# Patient Record
Sex: Female | Born: 1940 | Race: White | Hispanic: No | Smoking: Never smoker
Health system: Southern US, Community
[De-identification: ages and names within clinical notes are randomized; demographics above are authoritative.]

## PROBLEM LIST (undated history)

## (undated) DIAGNOSIS — I10 Essential (primary) hypertension: Secondary | ICD-10-CM

## (undated) DIAGNOSIS — K219 Gastro-esophageal reflux disease without esophagitis: Secondary | ICD-10-CM

## (undated) DIAGNOSIS — E119 Type 2 diabetes mellitus without complications: Secondary | ICD-10-CM

---

## 2004-06-01 ENCOUNTER — Ambulatory Visit (HOSPITAL_BASED_OUTPATIENT_CLINIC_OR_DEPARTMENT_OTHER): Admission: RE | Admit: 2004-06-01 | Discharge: 2004-06-01 | Payer: Self-pay | Admitting: *Deleted

## 2004-06-01 ENCOUNTER — Ambulatory Visit (HOSPITAL_COMMUNITY): Admission: RE | Admit: 2004-06-01 | Discharge: 2004-06-01 | Payer: Self-pay | Admitting: *Deleted

## 2005-06-13 IMAGING — CR DG ANKLE COMPLETE 3+V*R*
3 series · 3 of 3 positions shown · non-contrast
Comparison: none

CLINICAL DATA: Fall, ankle pain

RIGHT ANKLE - 3 VIEW

[view not recorded (1 of 3)]
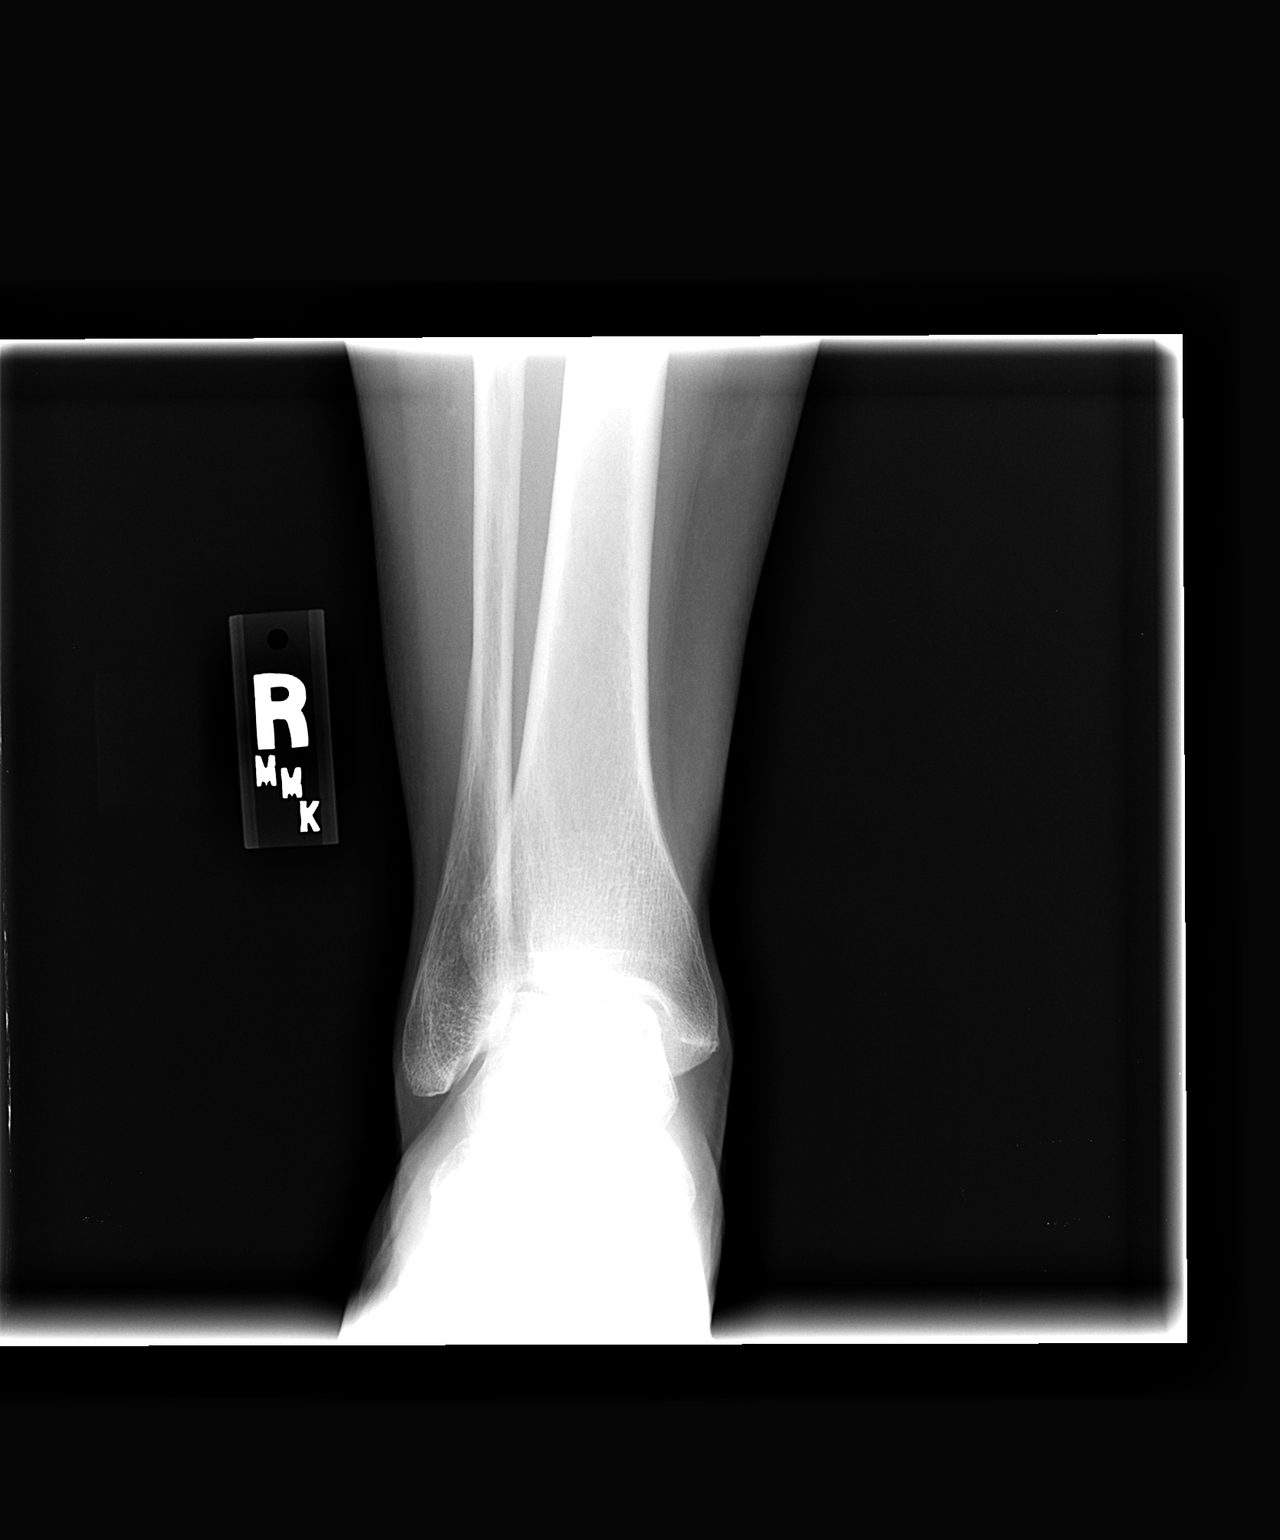

[view not recorded (2 of 3)]
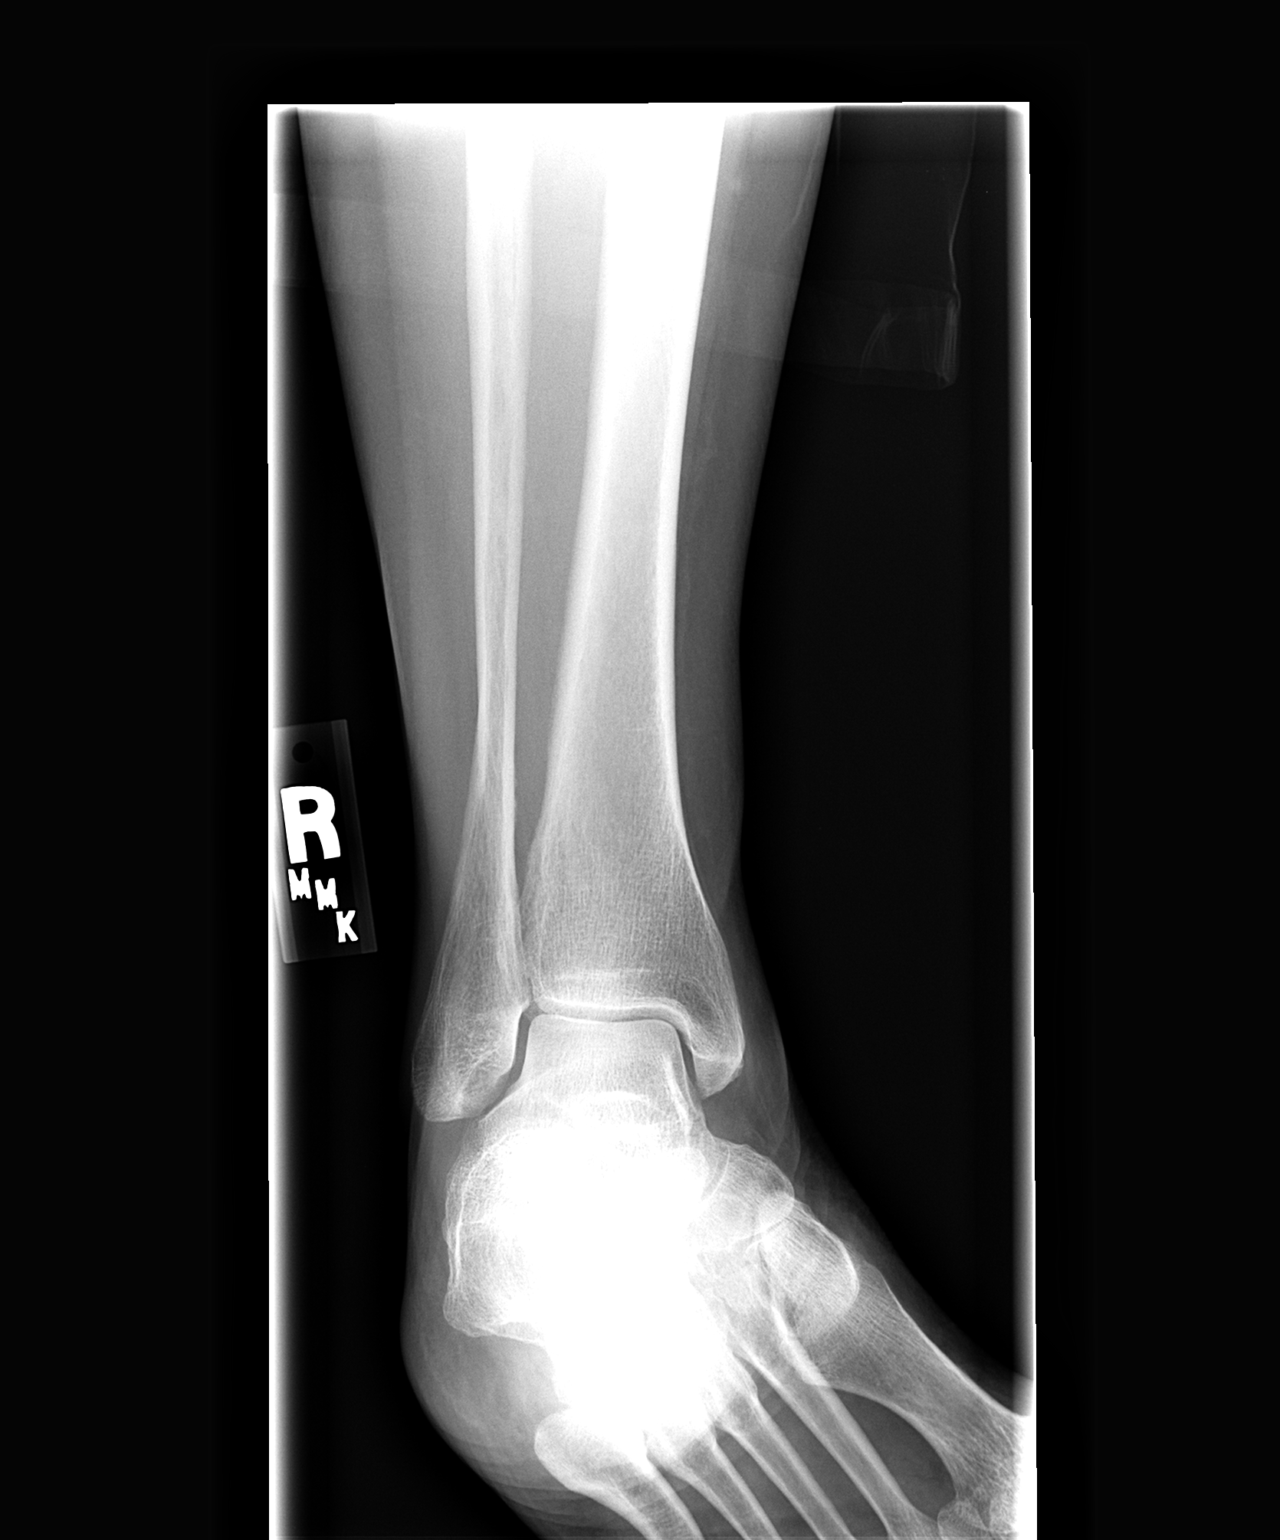

[view not recorded (3 of 3)]
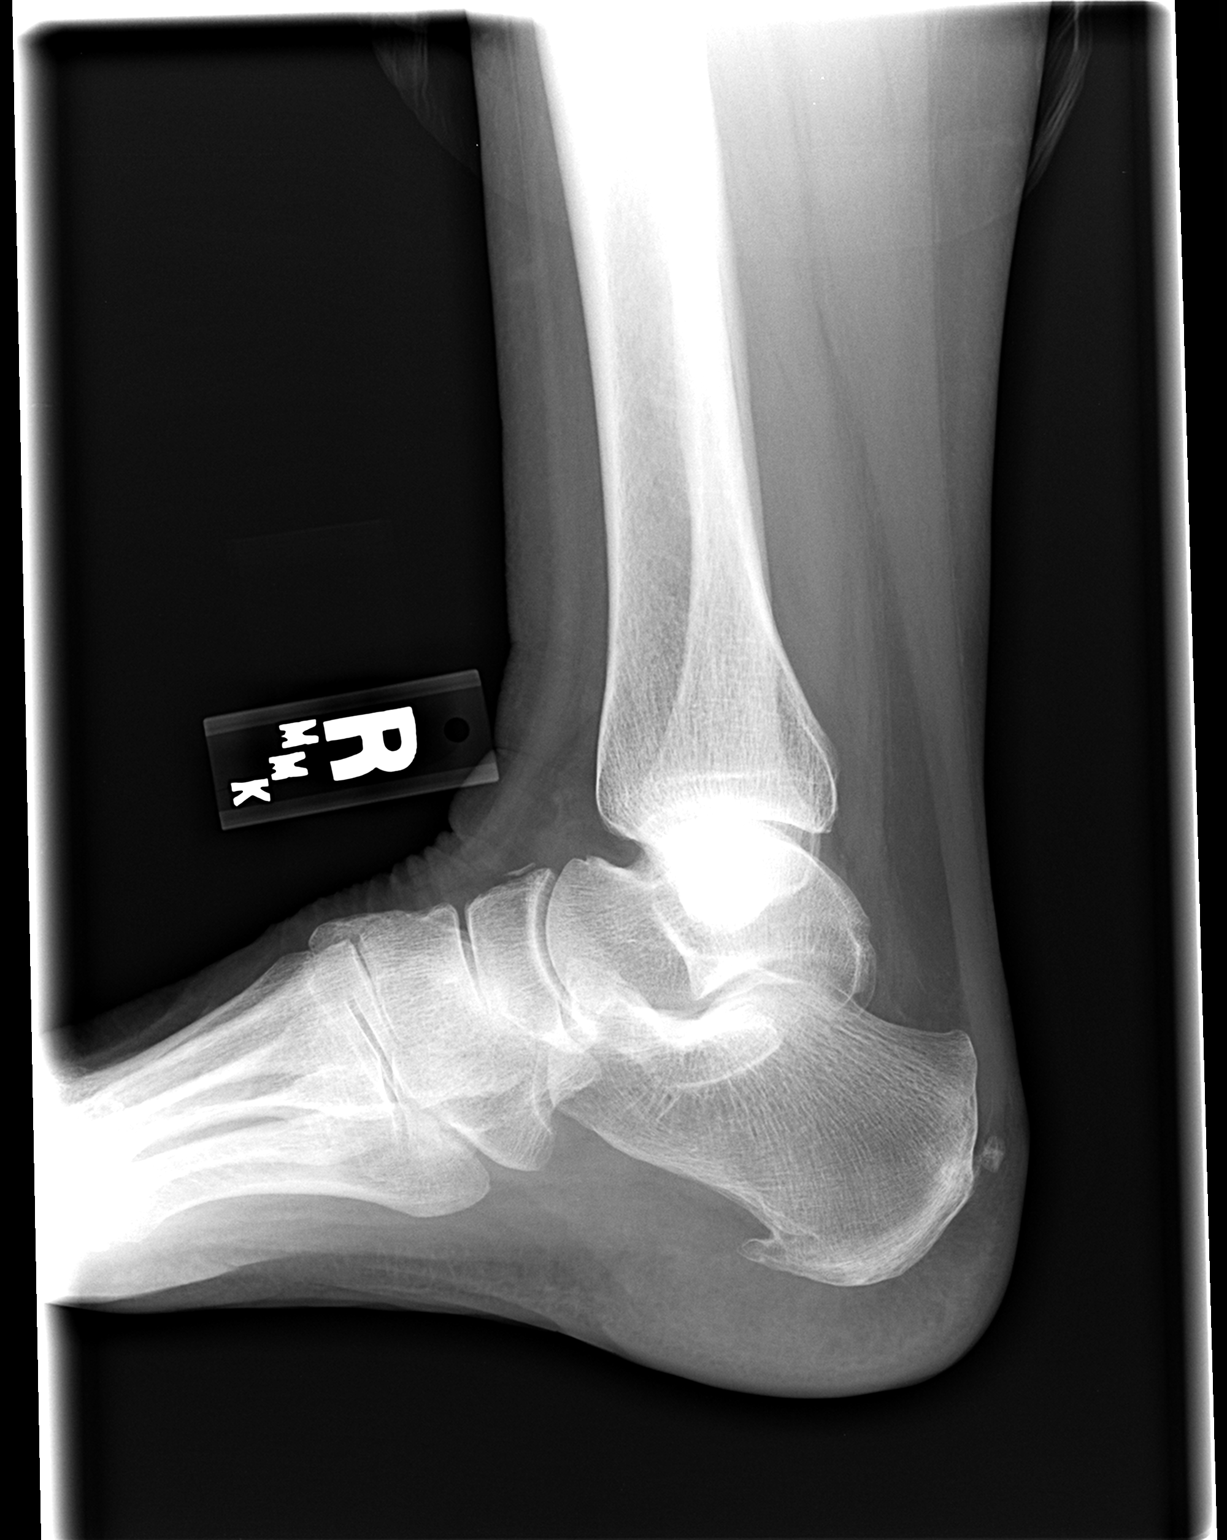

[3 of 3 positions shown; findings below may reference images not displayed]

FINDINGS: Linear bone fragment noted along the anterior surface of the talus,
possibly an avulsion fracture. Recommend correlation for pain in this area.
Plantar calcaneal spurs noted.

IMPRESSION

Probable avulsion off the anterior aspect of the right talus. Recommend
correlation for pain in this area.

## 2005-06-13 IMAGING — CR DG WRIST COMPLETE 3+V*L*
2 series · 2 of 2 positions shown · non-contrast
Comparison: none

CLINICAL DATA: Postreduction

LEFT WRIST - 2 VIEW

[view not recorded (1 of 2)]
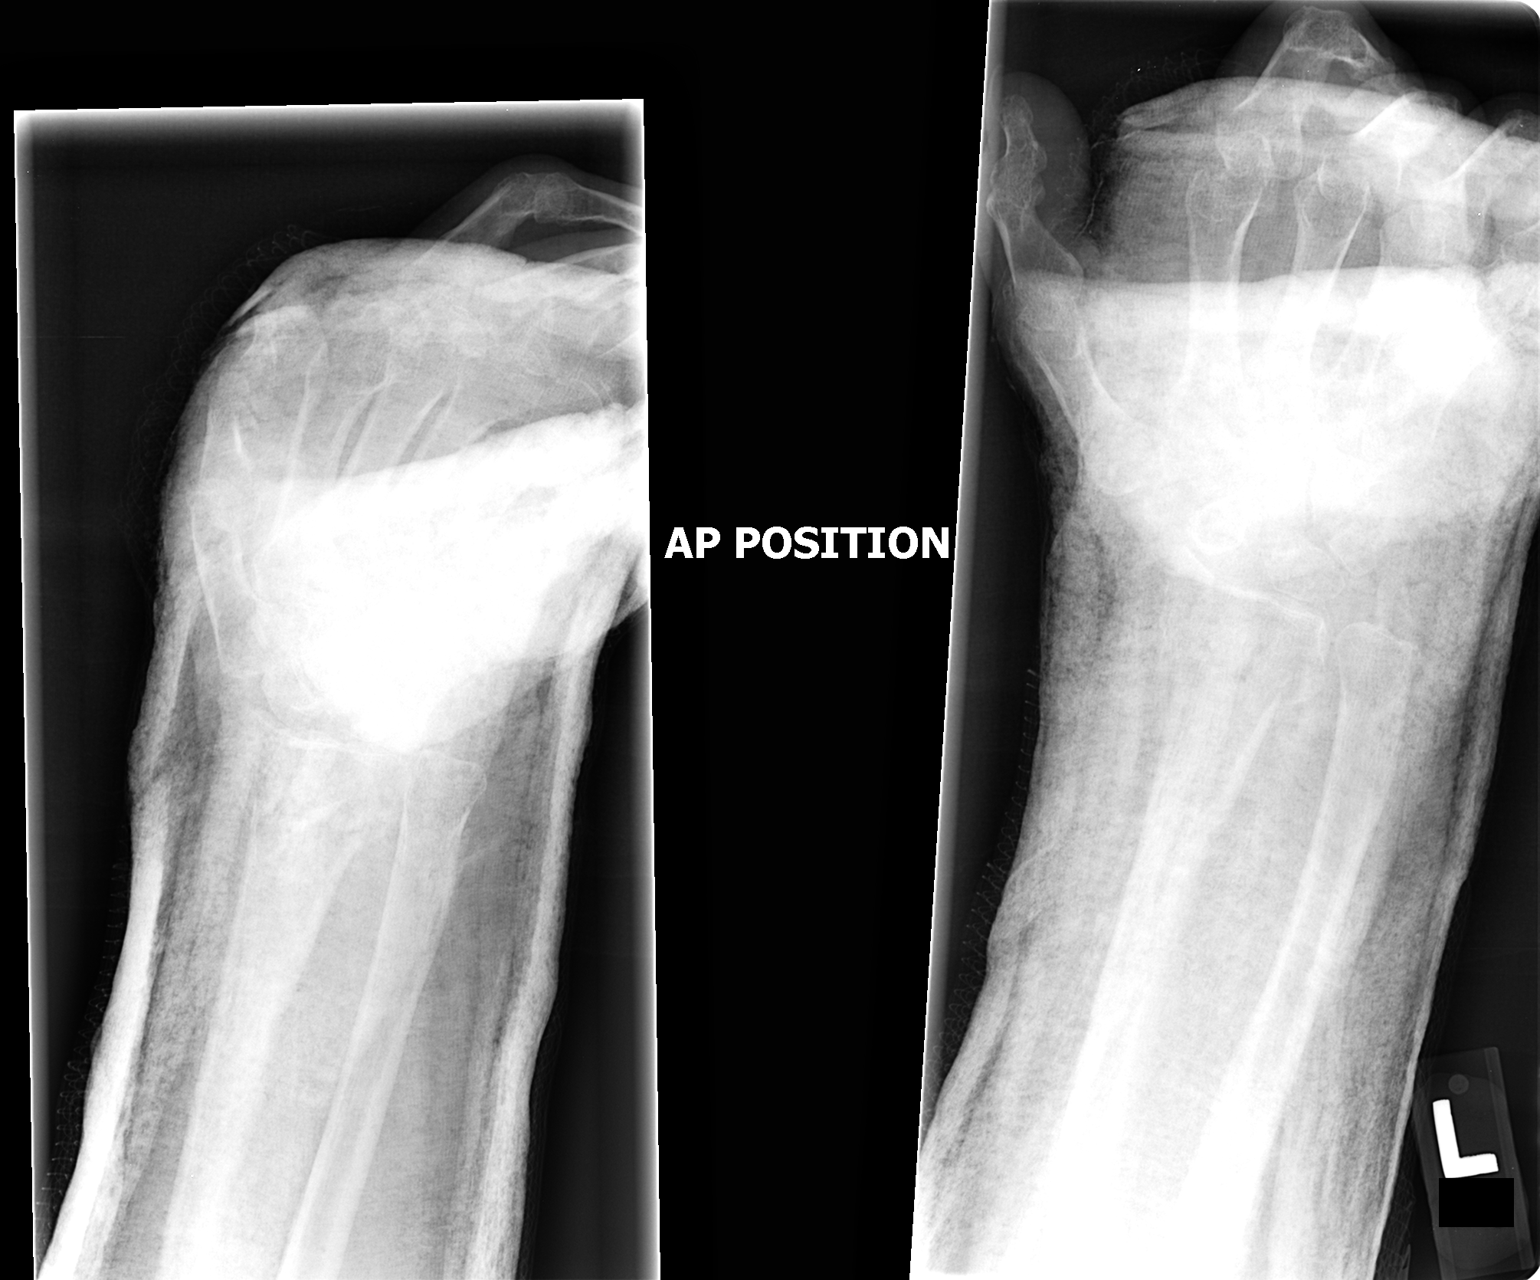

[view not recorded (2 of 2)]
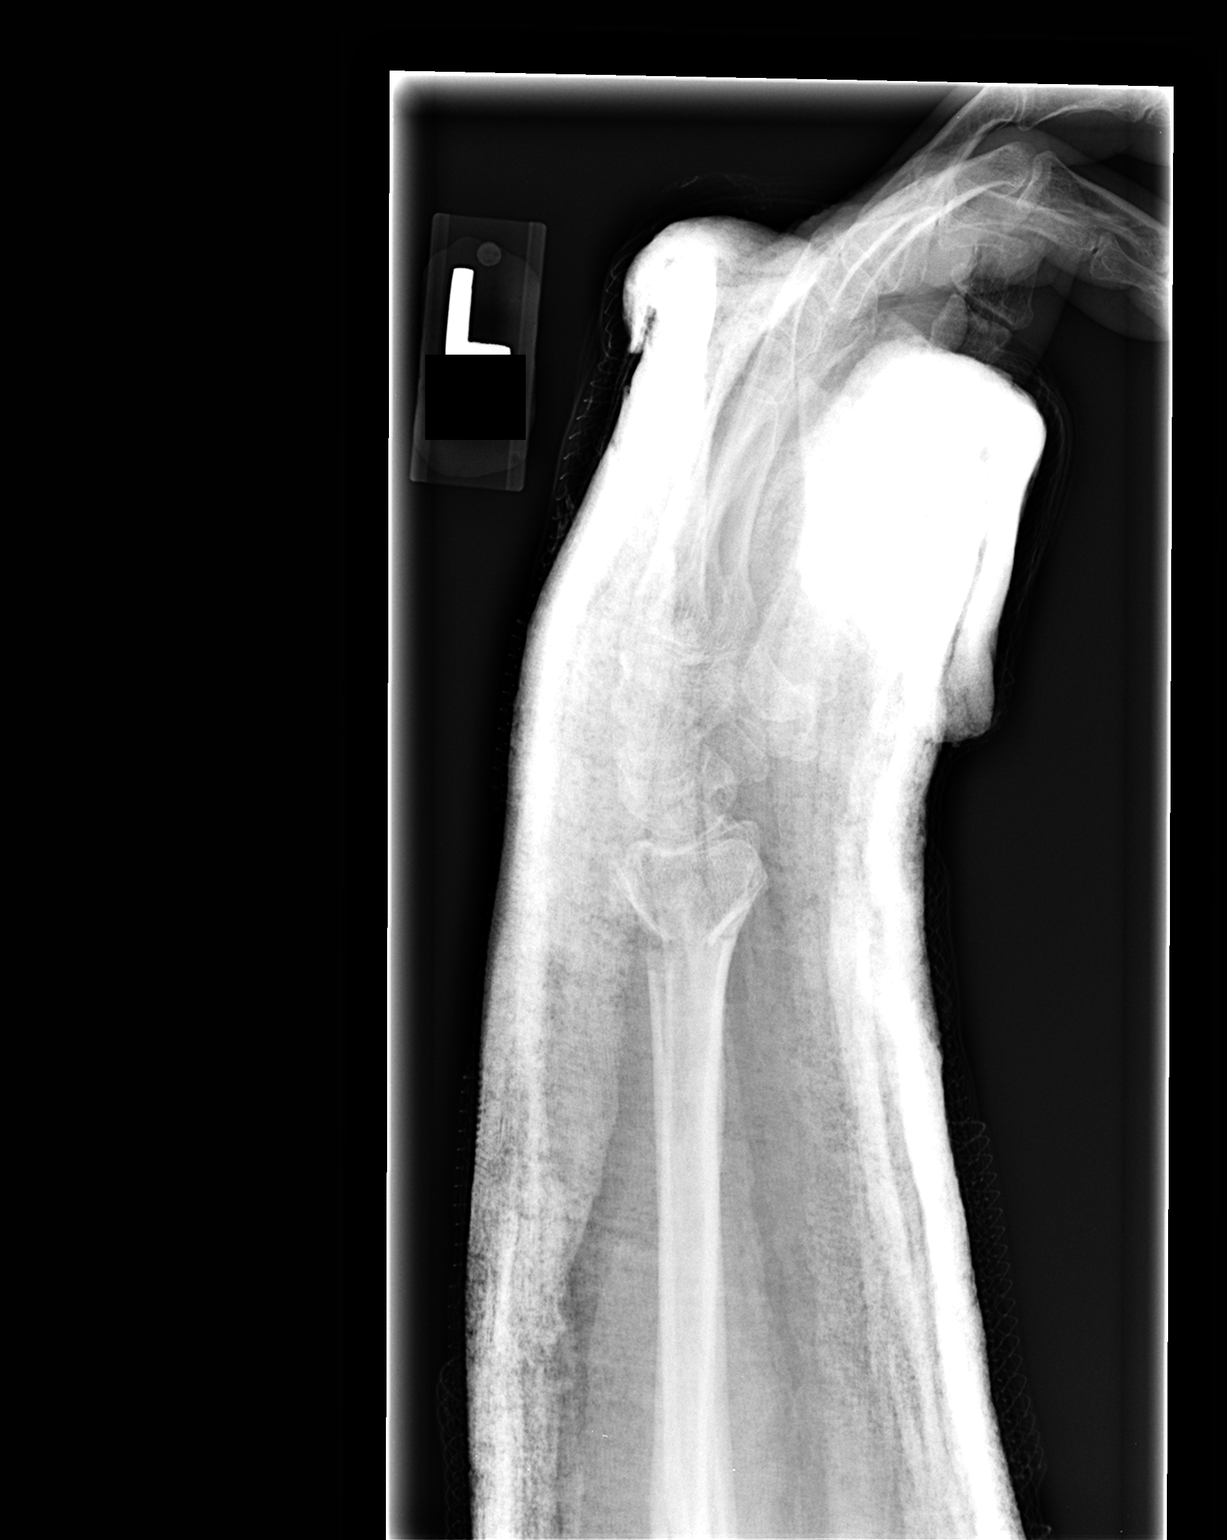

[2 of 2 positions shown; findings below may reference images not displayed]

FINDINGS: In plaster views of the left wrist show interval reduction of the
angulated left distal radial fracture. Near anatomic alignment of fracture
fragments.

IMPRESSION

Postreduction. Near anatomic alignment.

## 2005-06-13 IMAGING — CR DG WRIST COMPLETE 3+V*L*
4 series · 4 of 4 positions shown · non-contrast
Comparison: none

CLINICAL DATA: Fall with left wrist pain. 
 LEFT WRIST - 4 VIEW:

[view not recorded (1 of 4)]
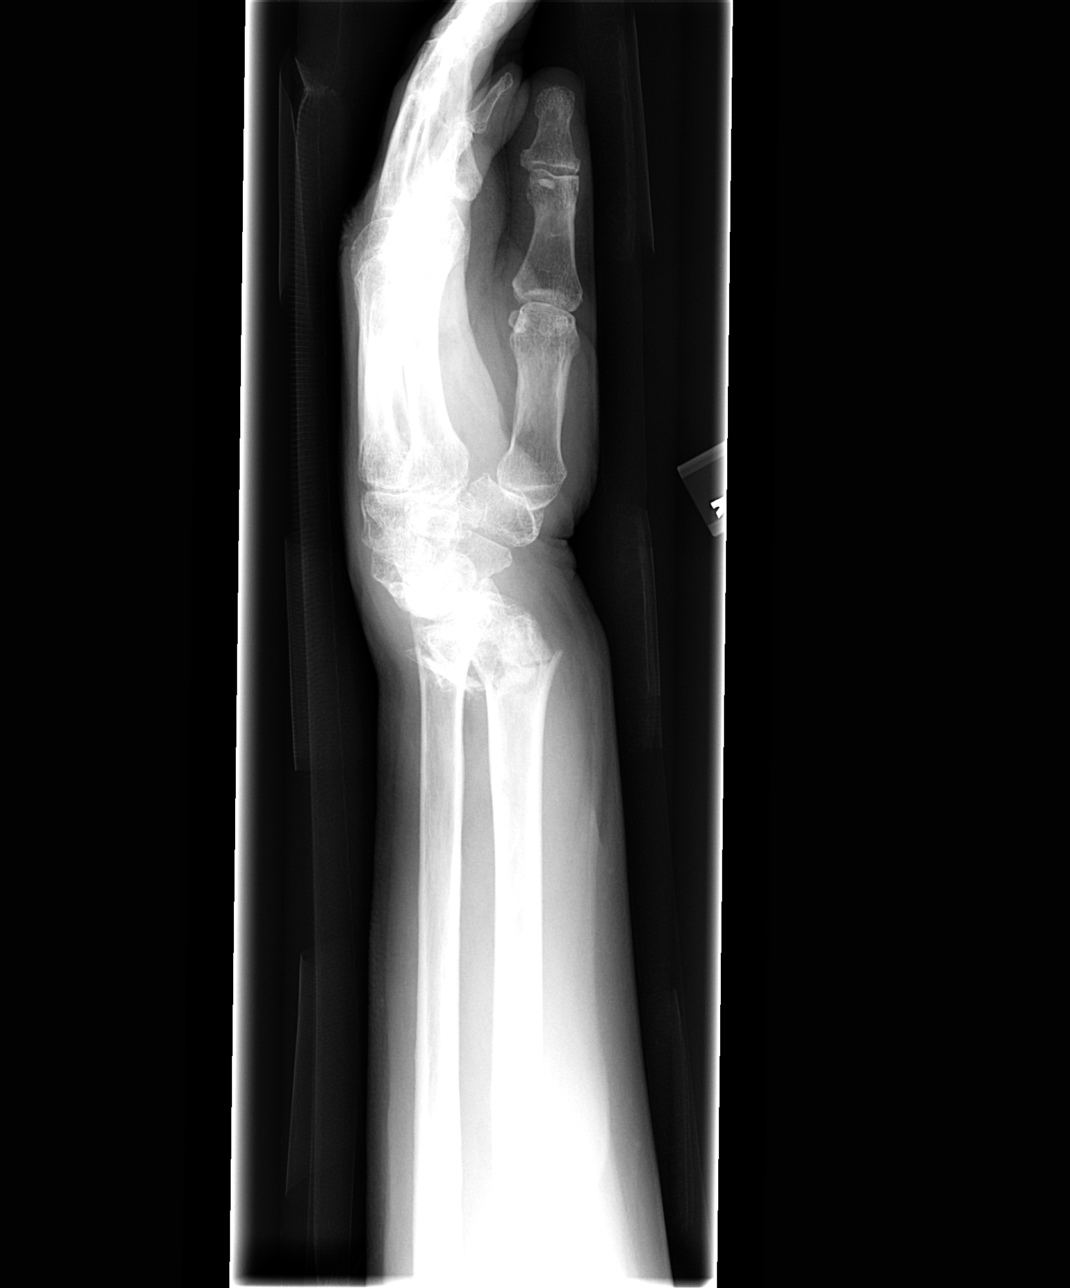

[view not recorded (2 of 4)]
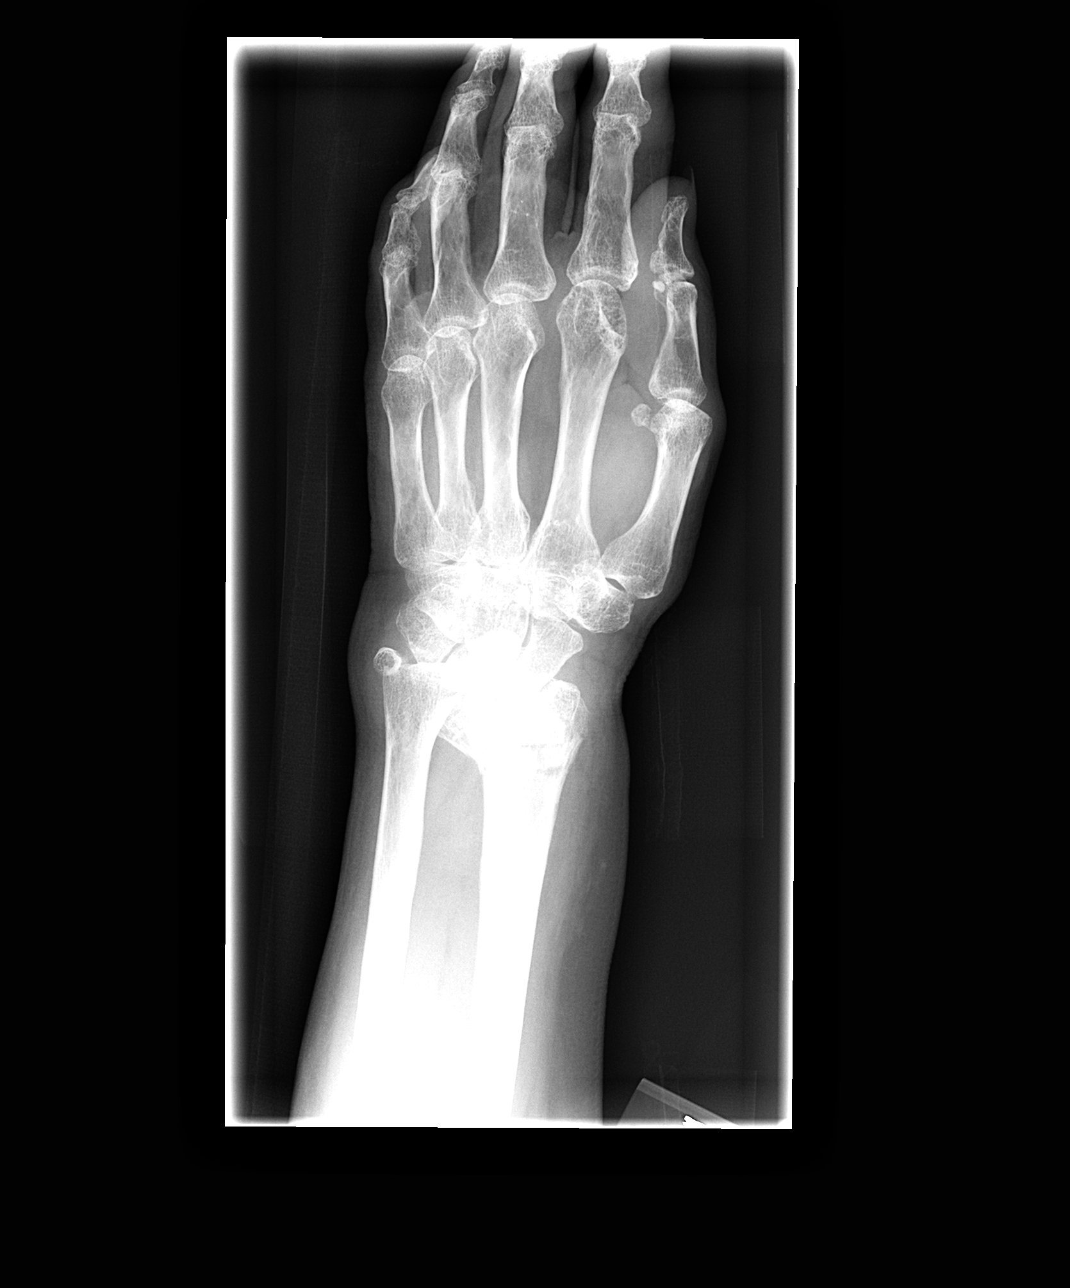

[view not recorded (3 of 4)]
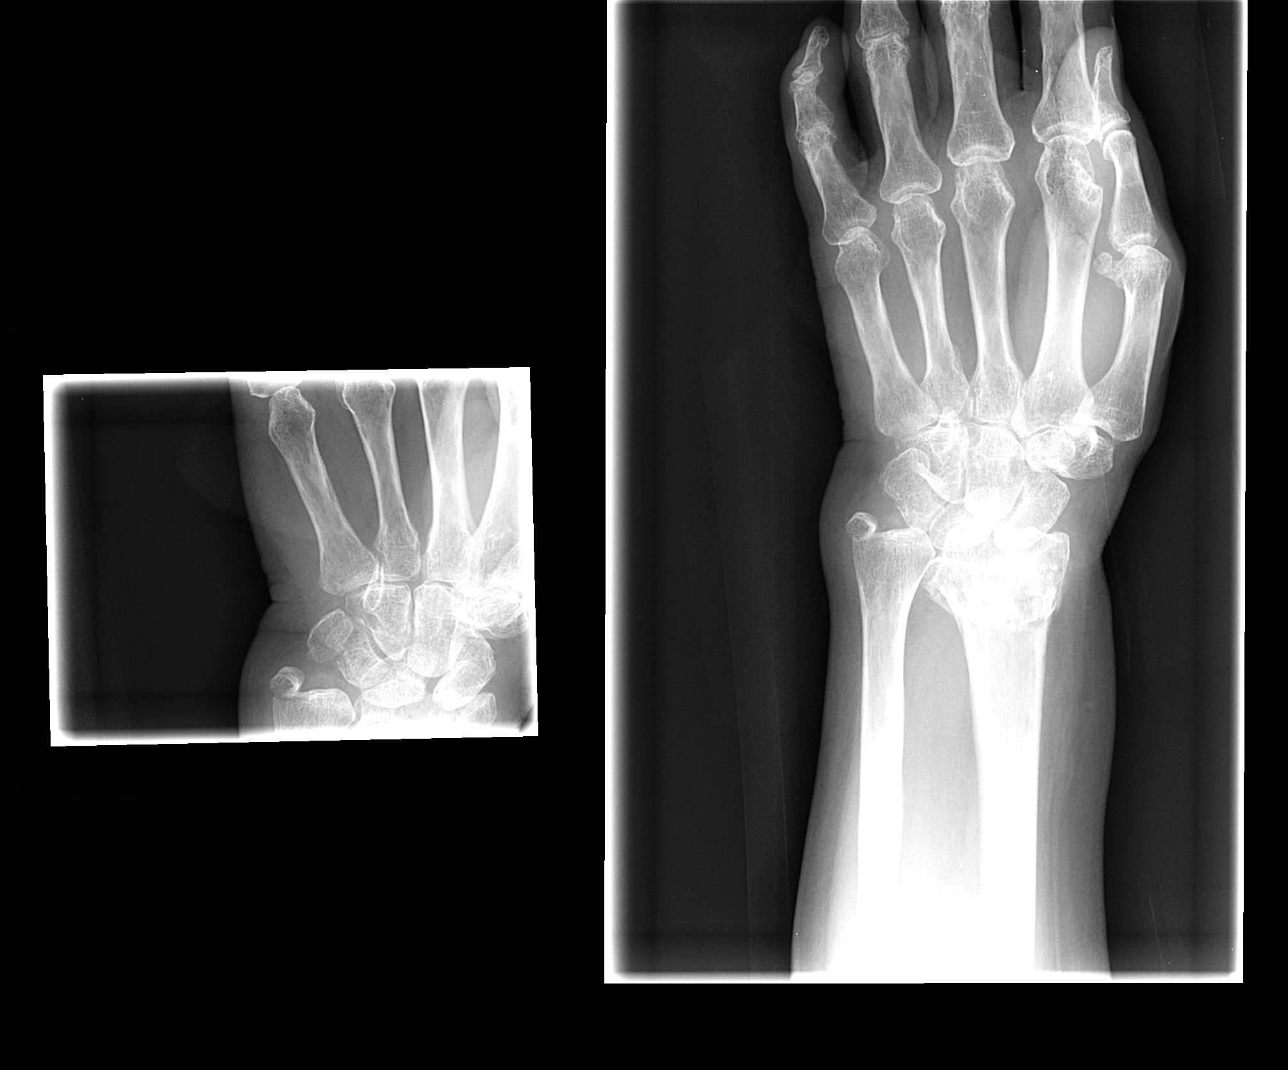

[view not recorded (4 of 4)]
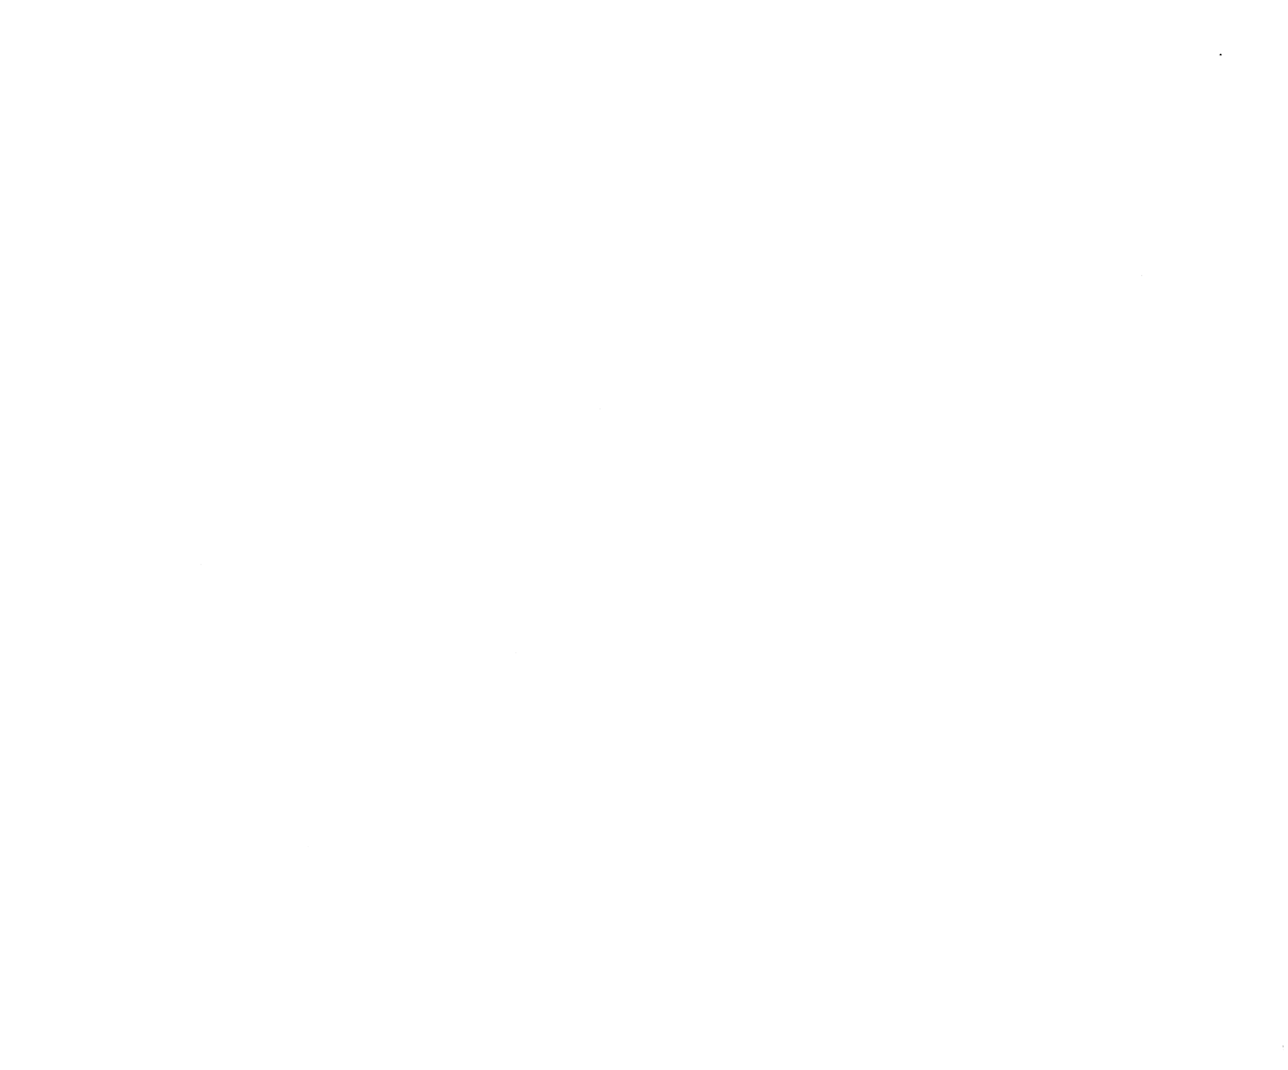

[4 of 4 positions shown; findings below may reference images not displayed]

FINDINGS: Comminuted distal radial fracture is angulated volar approximately 35 degrees.  There is no definite extension of fracture fragment into the joint. The bones are diffusely osteopenic.   A well corticated ulnar styloid fragment is noted.   This may be related to more remote trauma. Extensive soft tissue swelling overlies the wrist.   The fracture likely extends to the articular surface.
IMPRESSION: 1.  Comminuted distal radial fracture with probable extension to the articular surface.  
 2.  Well corticated ulnar styloid fragment may reflect remote trauma. 
 3.  Extensive soft tissue swelling. 
 4.  Severe osteopenia.

## 2005-06-14 ENCOUNTER — Inpatient Hospital Stay (HOSPITAL_COMMUNITY): Admission: EM | Admit: 2005-06-14 | Discharge: 2005-06-15 | Payer: Self-pay | Admitting: Emergency Medicine

## 2005-06-14 IMAGING — CR DG WRIST 2V*L*
2 series · 2 of 2 positions shown · non-contrast
Comparison: none

CLINICAL DATA: ORIF left wrist fracture

LEFT WRIST - 2 VIEW

[view not recorded (1 of 2)]
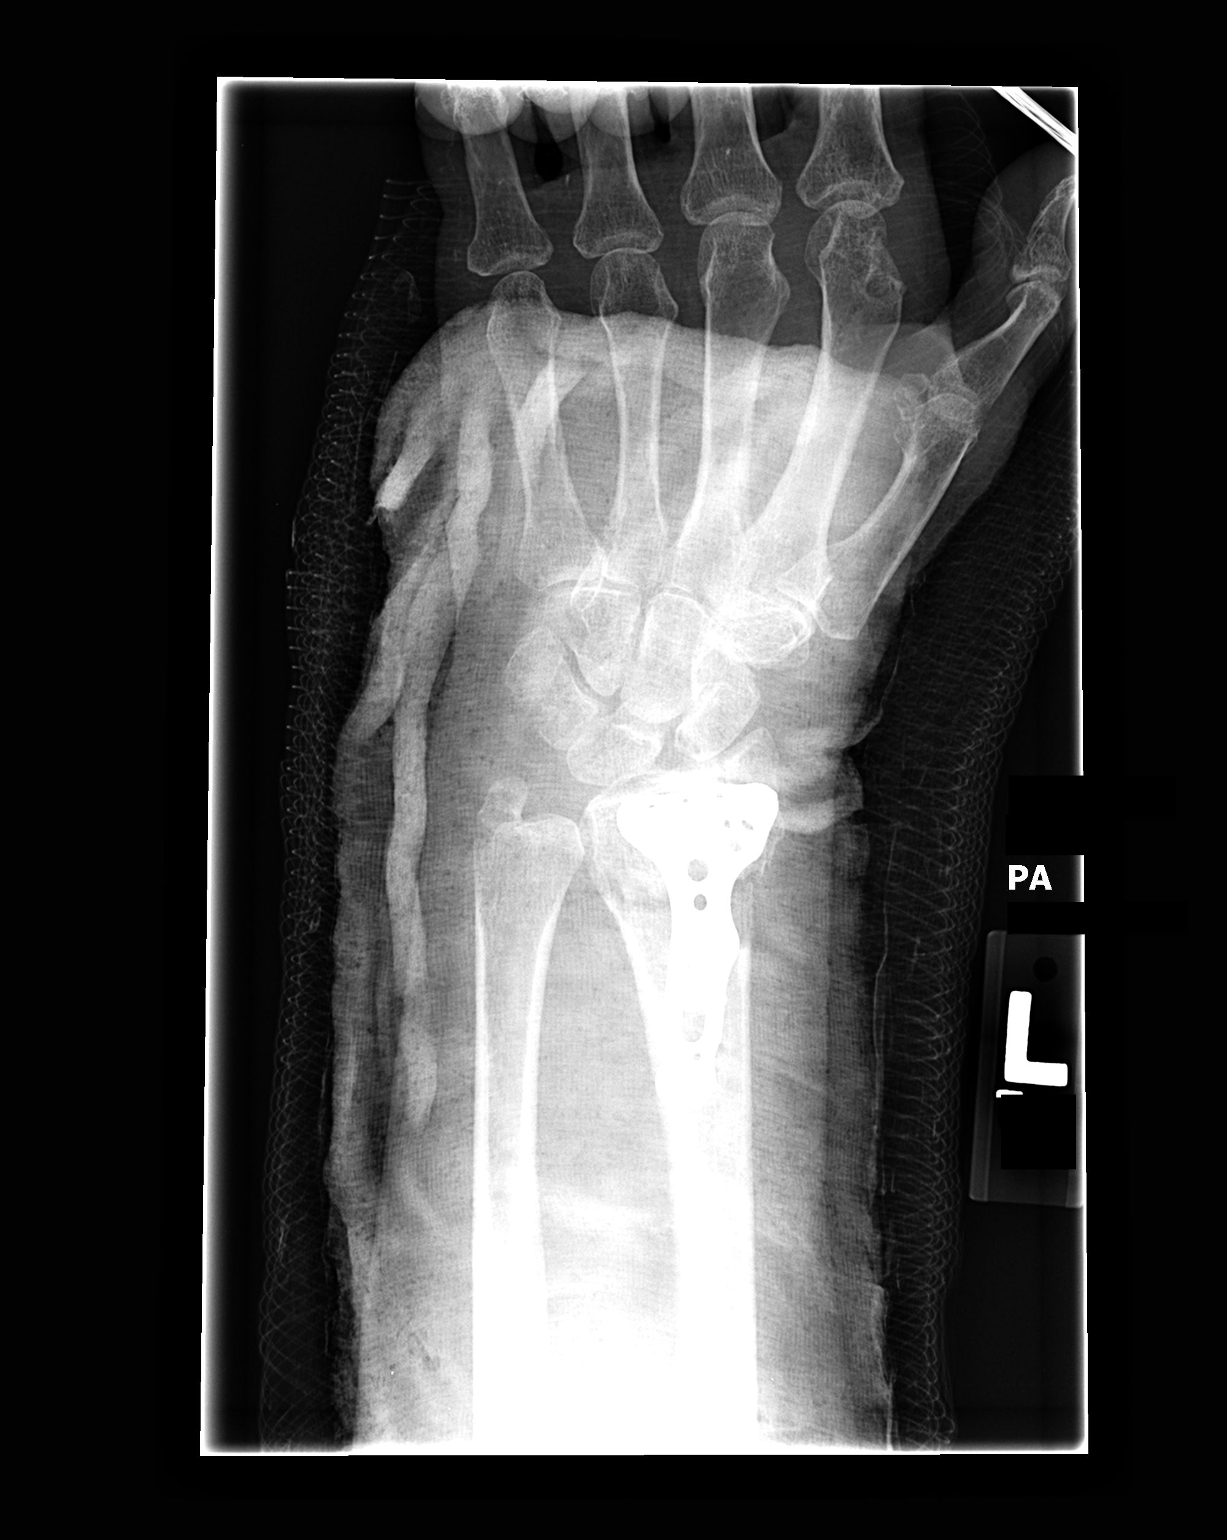

[view not recorded (2 of 2)]
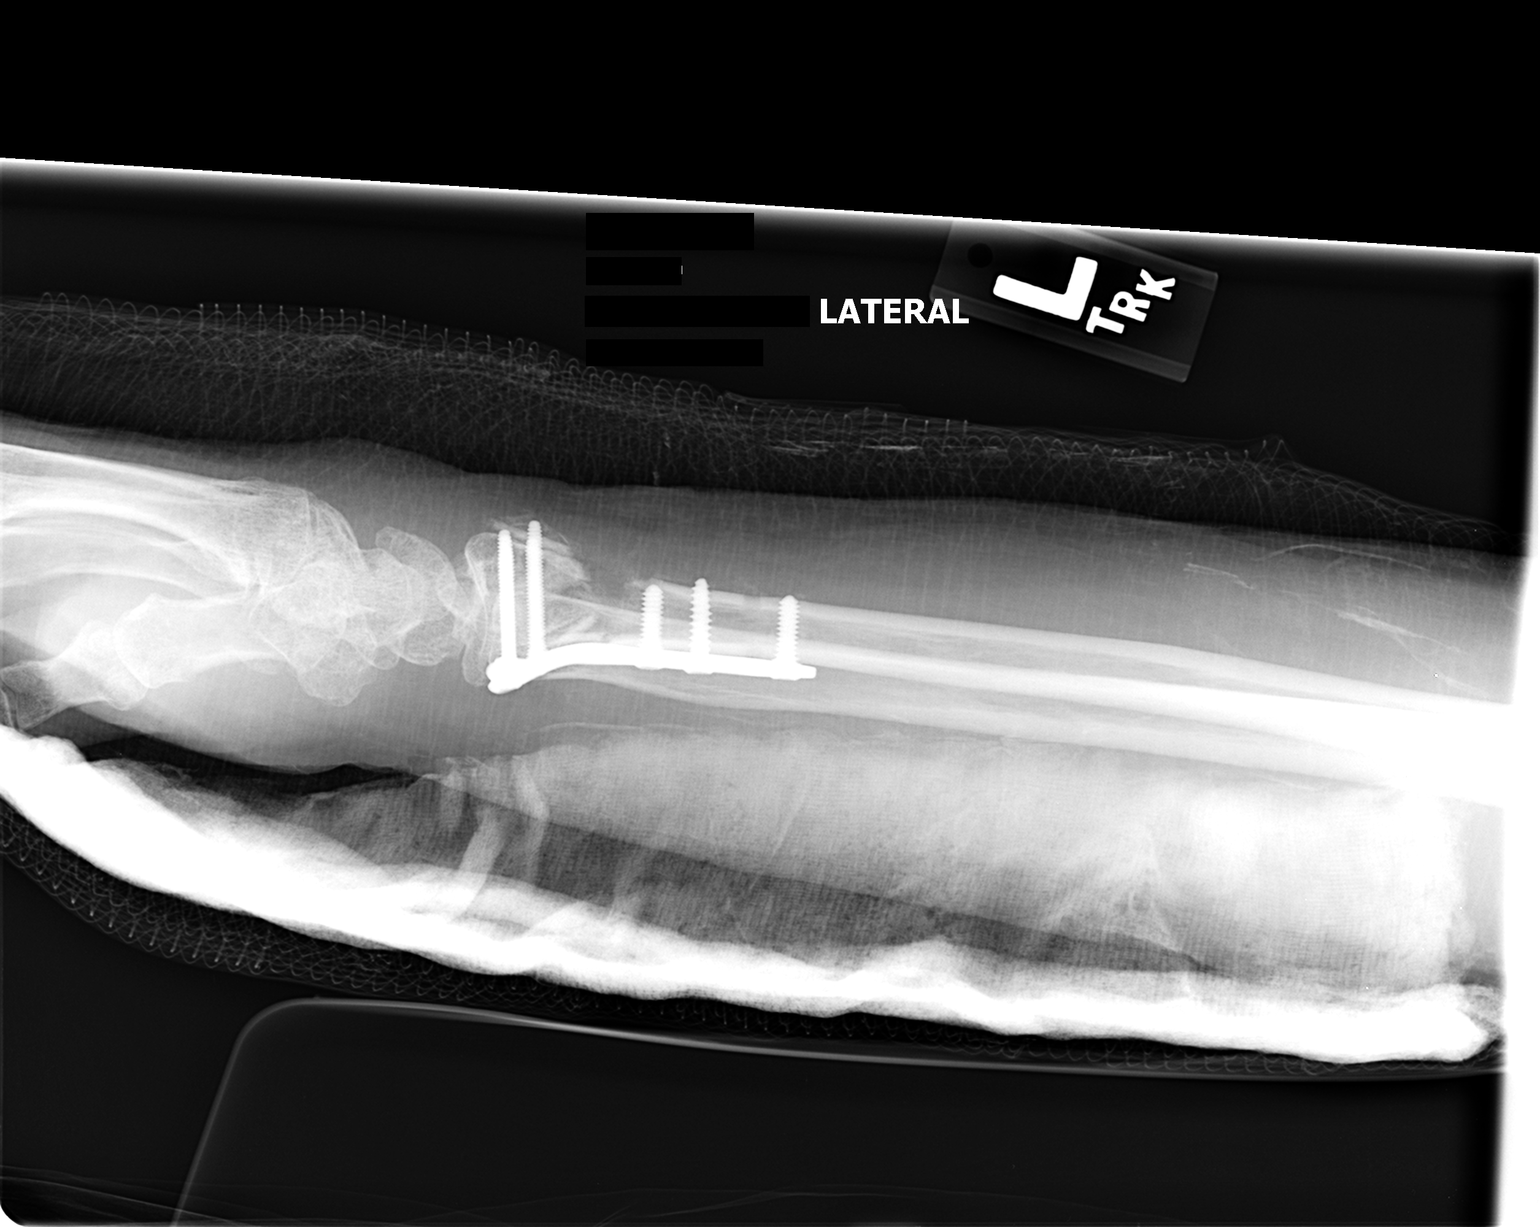

[2 of 2 positions shown; findings below may reference images not displayed]

FINDINGS: In splint views of the left wrist show the patient is status post
internal fixation of the distal left radial fracture. Near anatomic alignment.

IMPRESSION

ORIF distal left radial fracture.

## 2005-06-14 IMAGING — CR DG CHEST 2V
2 series · 2 of 2 positions shown · non-contrast
Comparison: none

CLINICAL DATA: Risk factor, preoperative workup. 
CHEST - 2 VIEW:

[w chest pa]
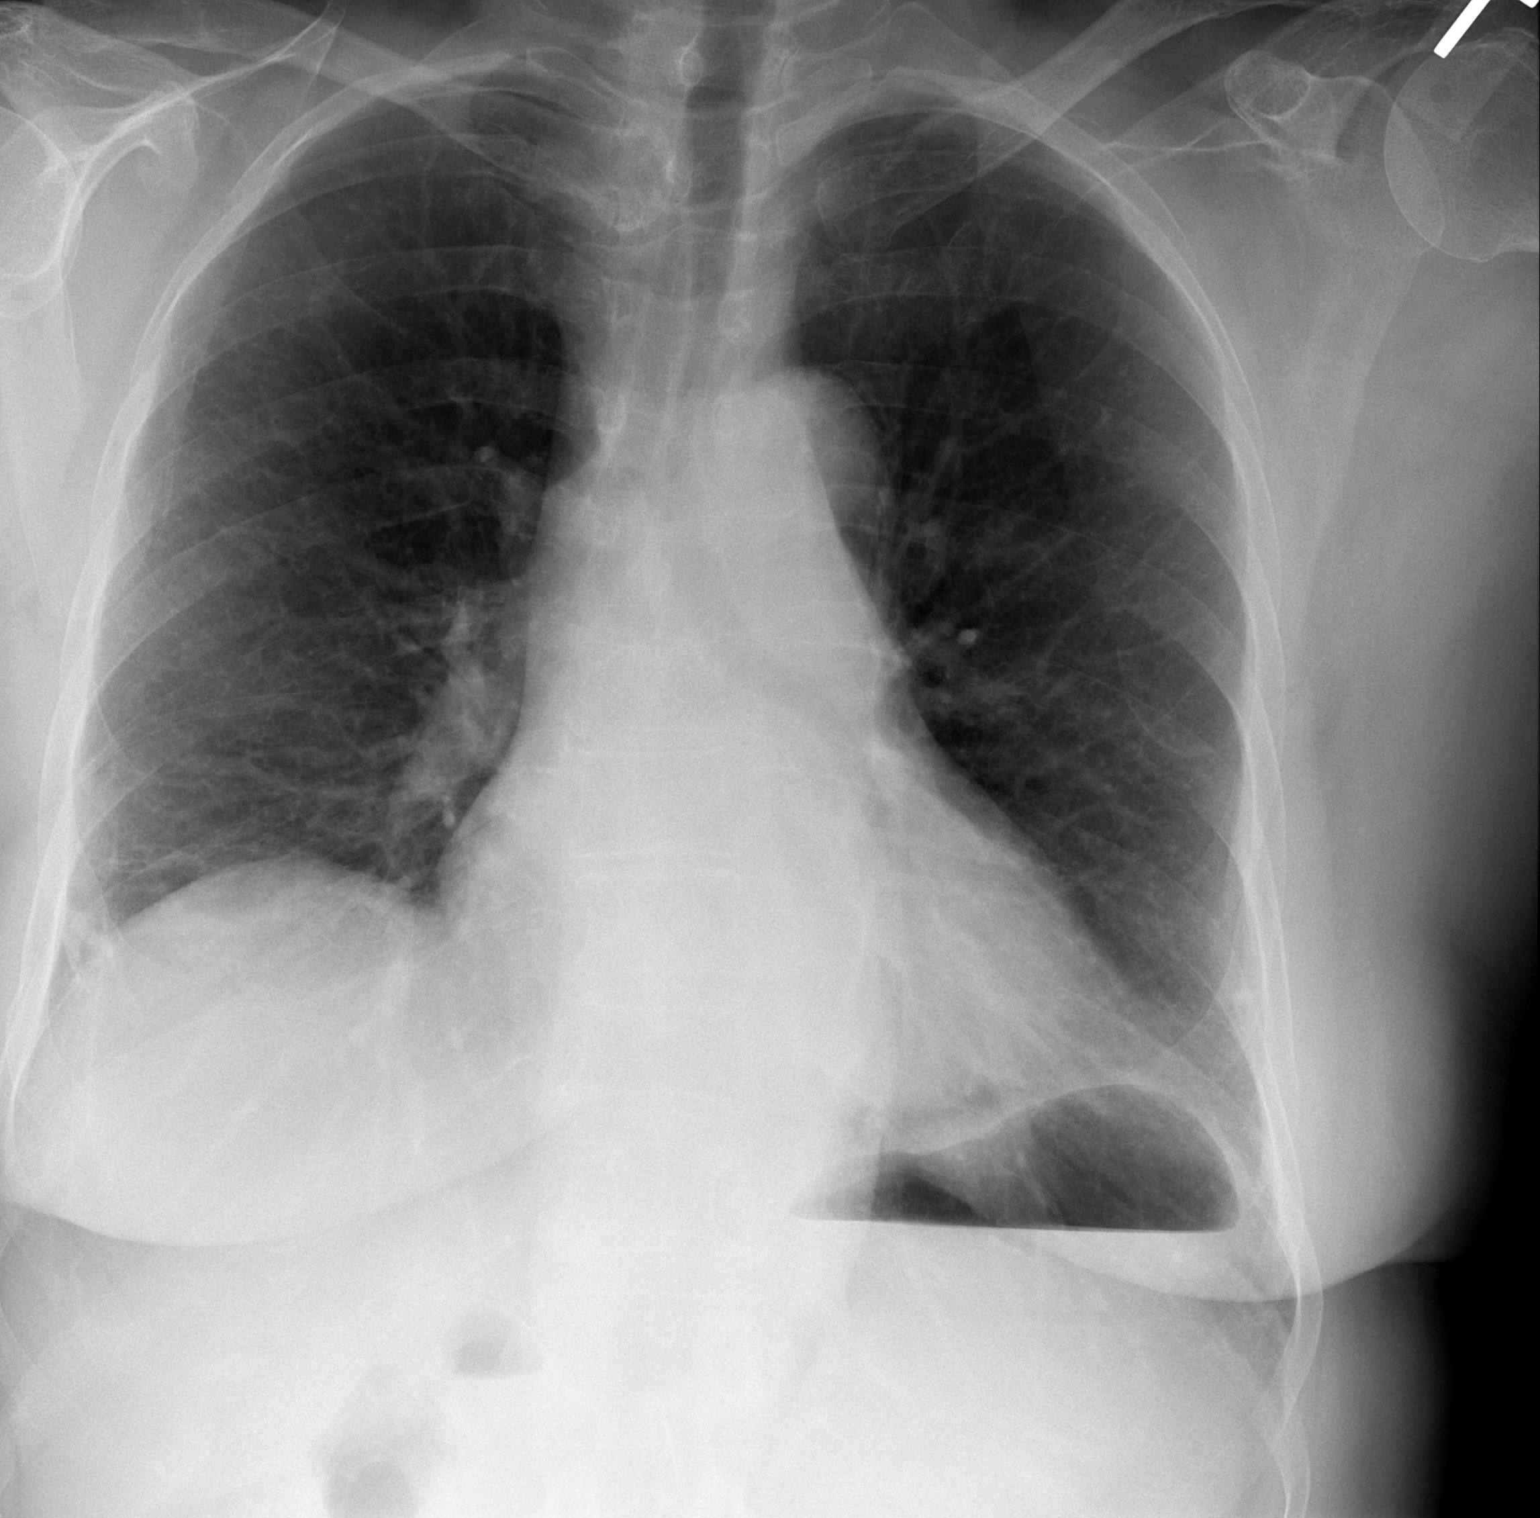

[w chest lat]
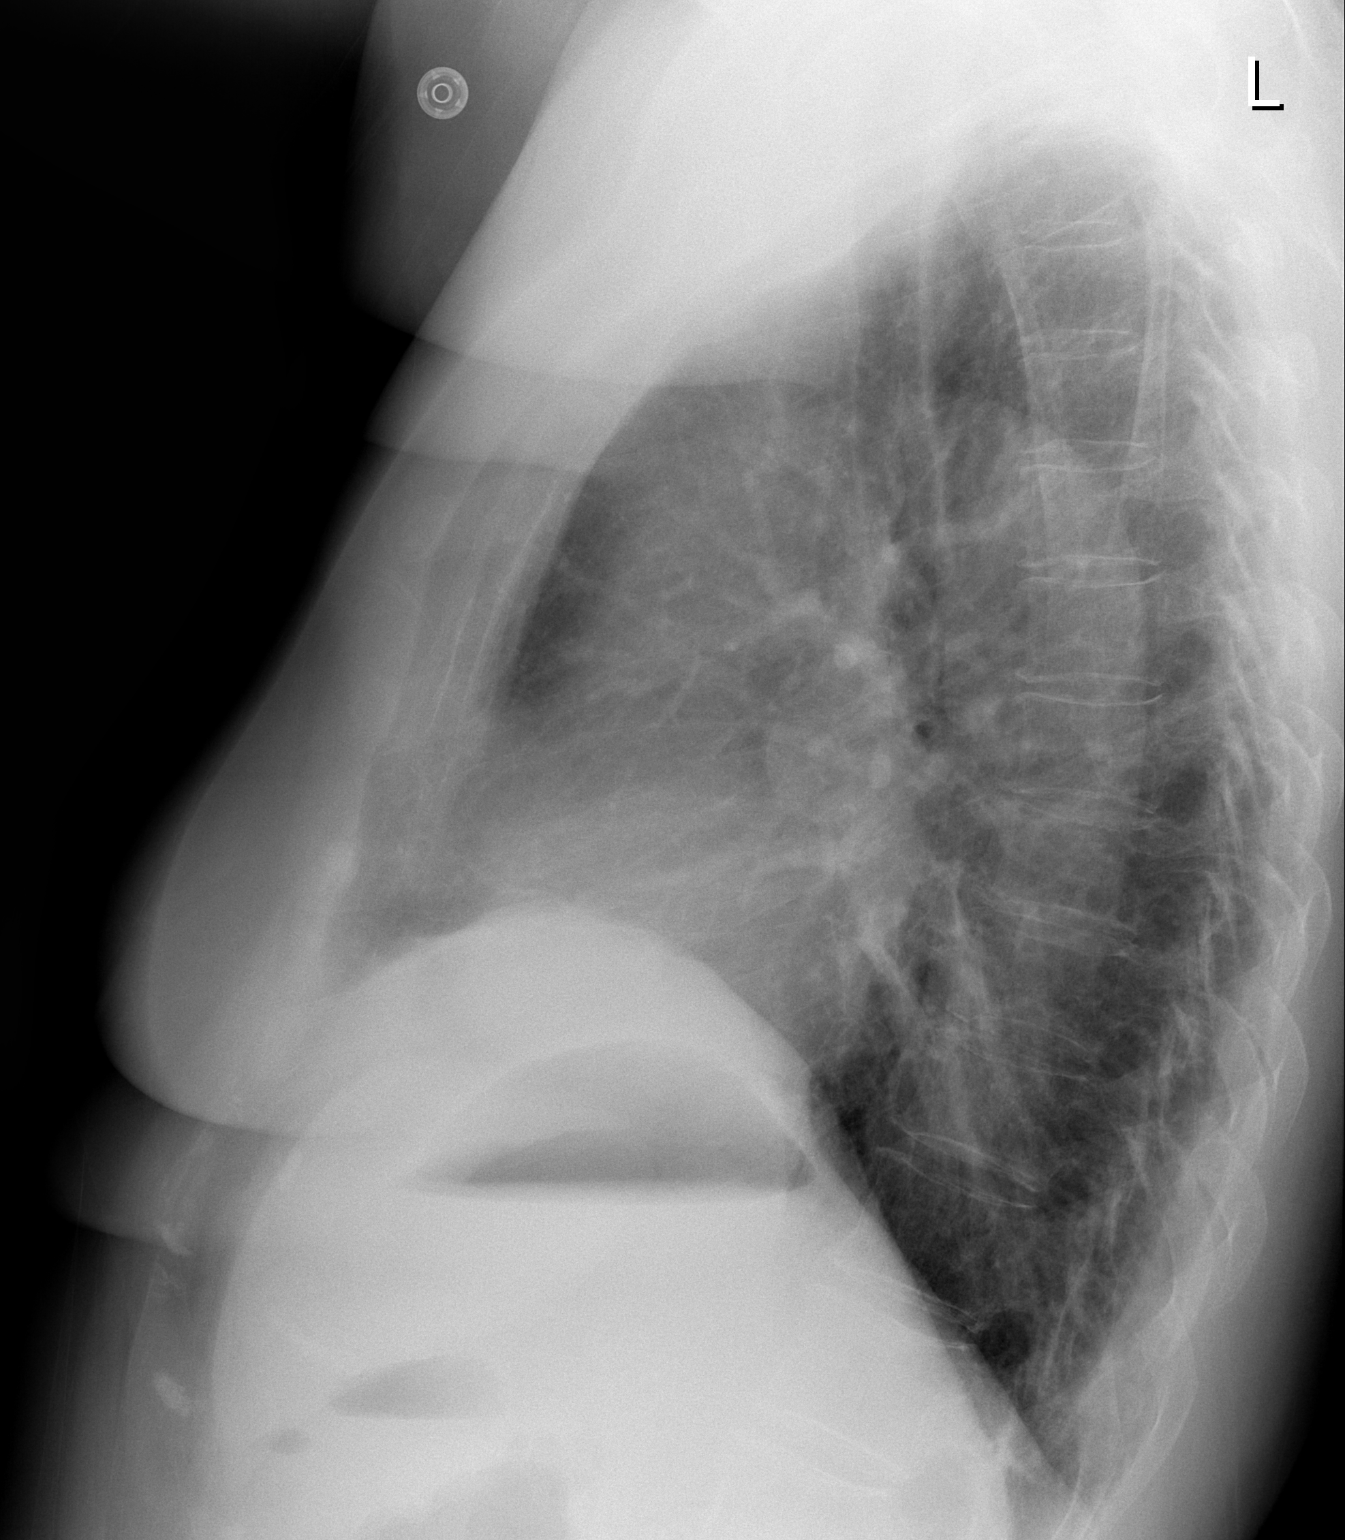

[2 of 2 positions shown; findings below may reference images not displayed]

FINDINGS: Mild elevation of right hemidiaphragm.  Linear densities, right lung base, compatible with scarring.  No prior films are available for correlation.  Cardiac size upper normal to minimally enlarged.  No pulmonary vascular congestion.  Diffuse osteopenia; intact bony thorax.
IMPRESSION: Negative for active chest disease.  Findings compatible with scarring at the right lateral base.  Incidentally, there may be an old fracture of the axillary aspect of the right seventh rib.  Cardiac size upper normal.  No acute chest findings.

## 2009-04-08 IMAGING — CR DG CHEST 2V
2 series · 2 of 2 positions shown · non-contrast
Comparison: [DATE]

CLINICAL DATA: Lumbar spondylosis, stenosis, hypertension,
diabetes, nonsmoker, preoperative assessment for L2-L3, L3-L4, and
L4-L5 decompression

CHEST - 2 VIEW

[view not recorded (1 of 2)]
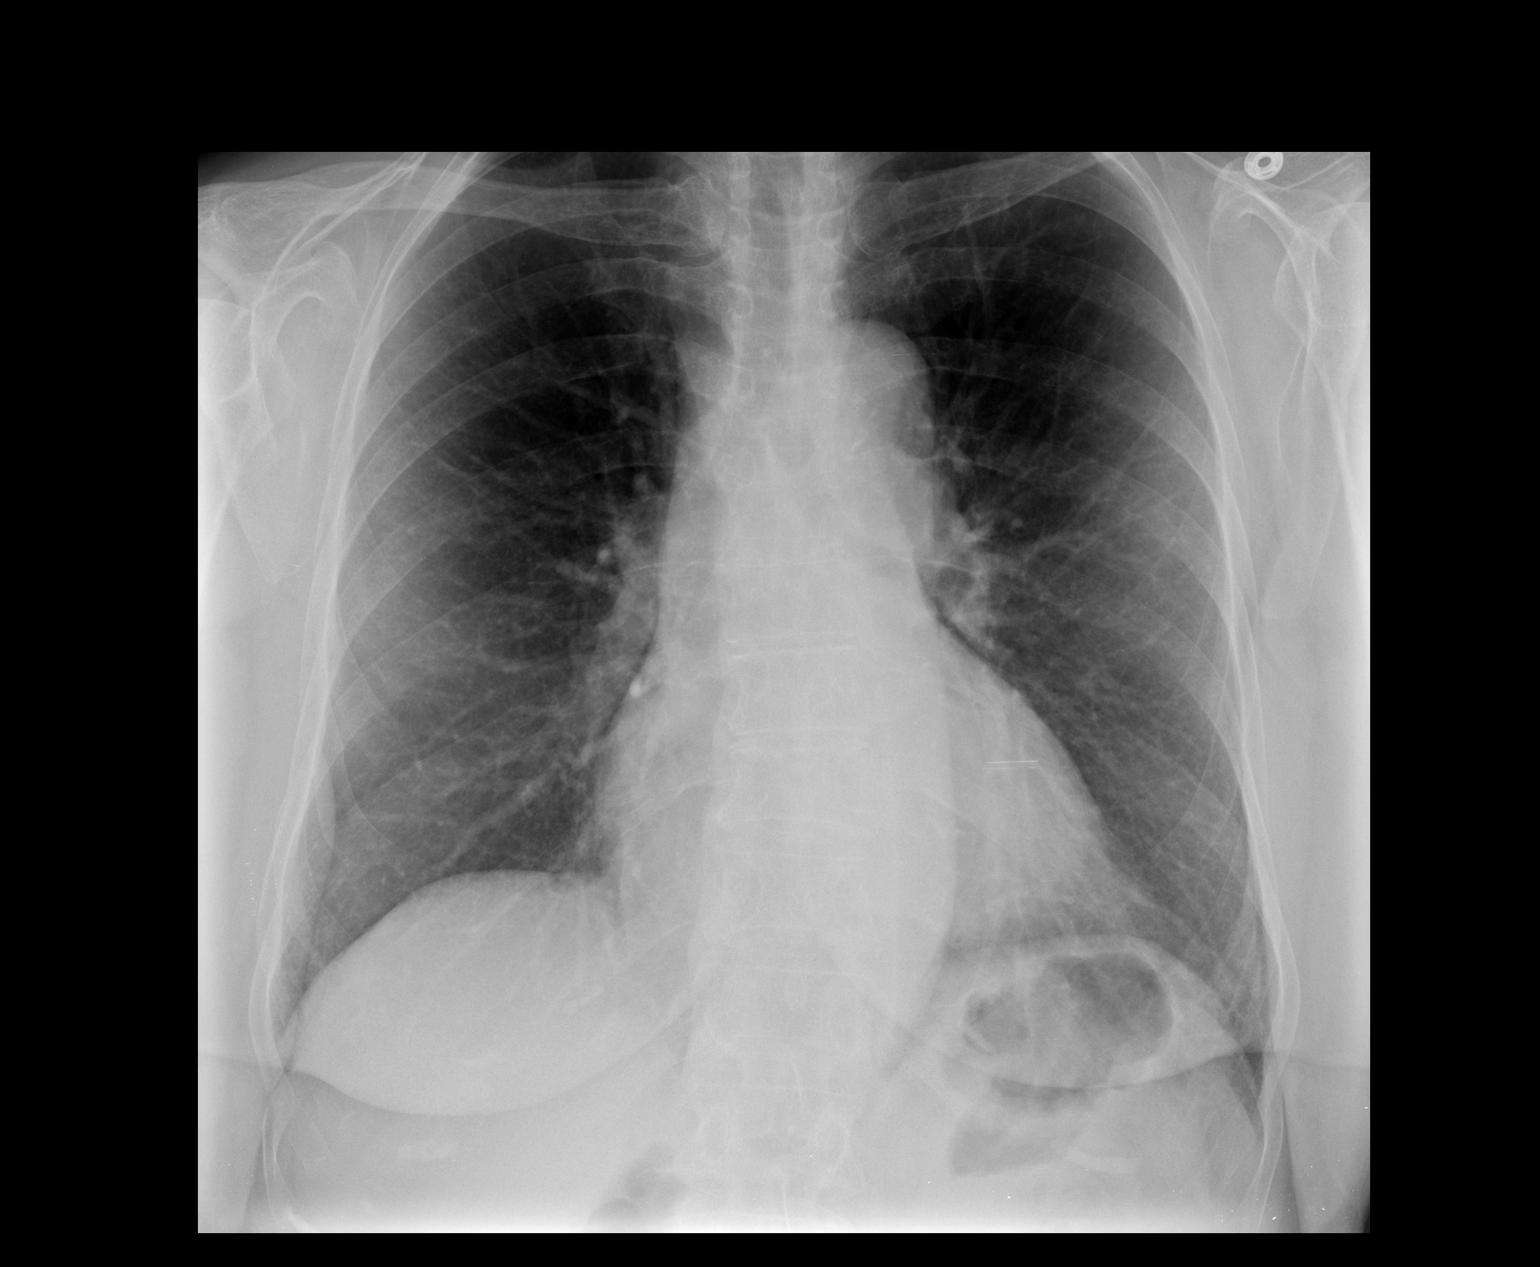

[view not recorded (2 of 2)]
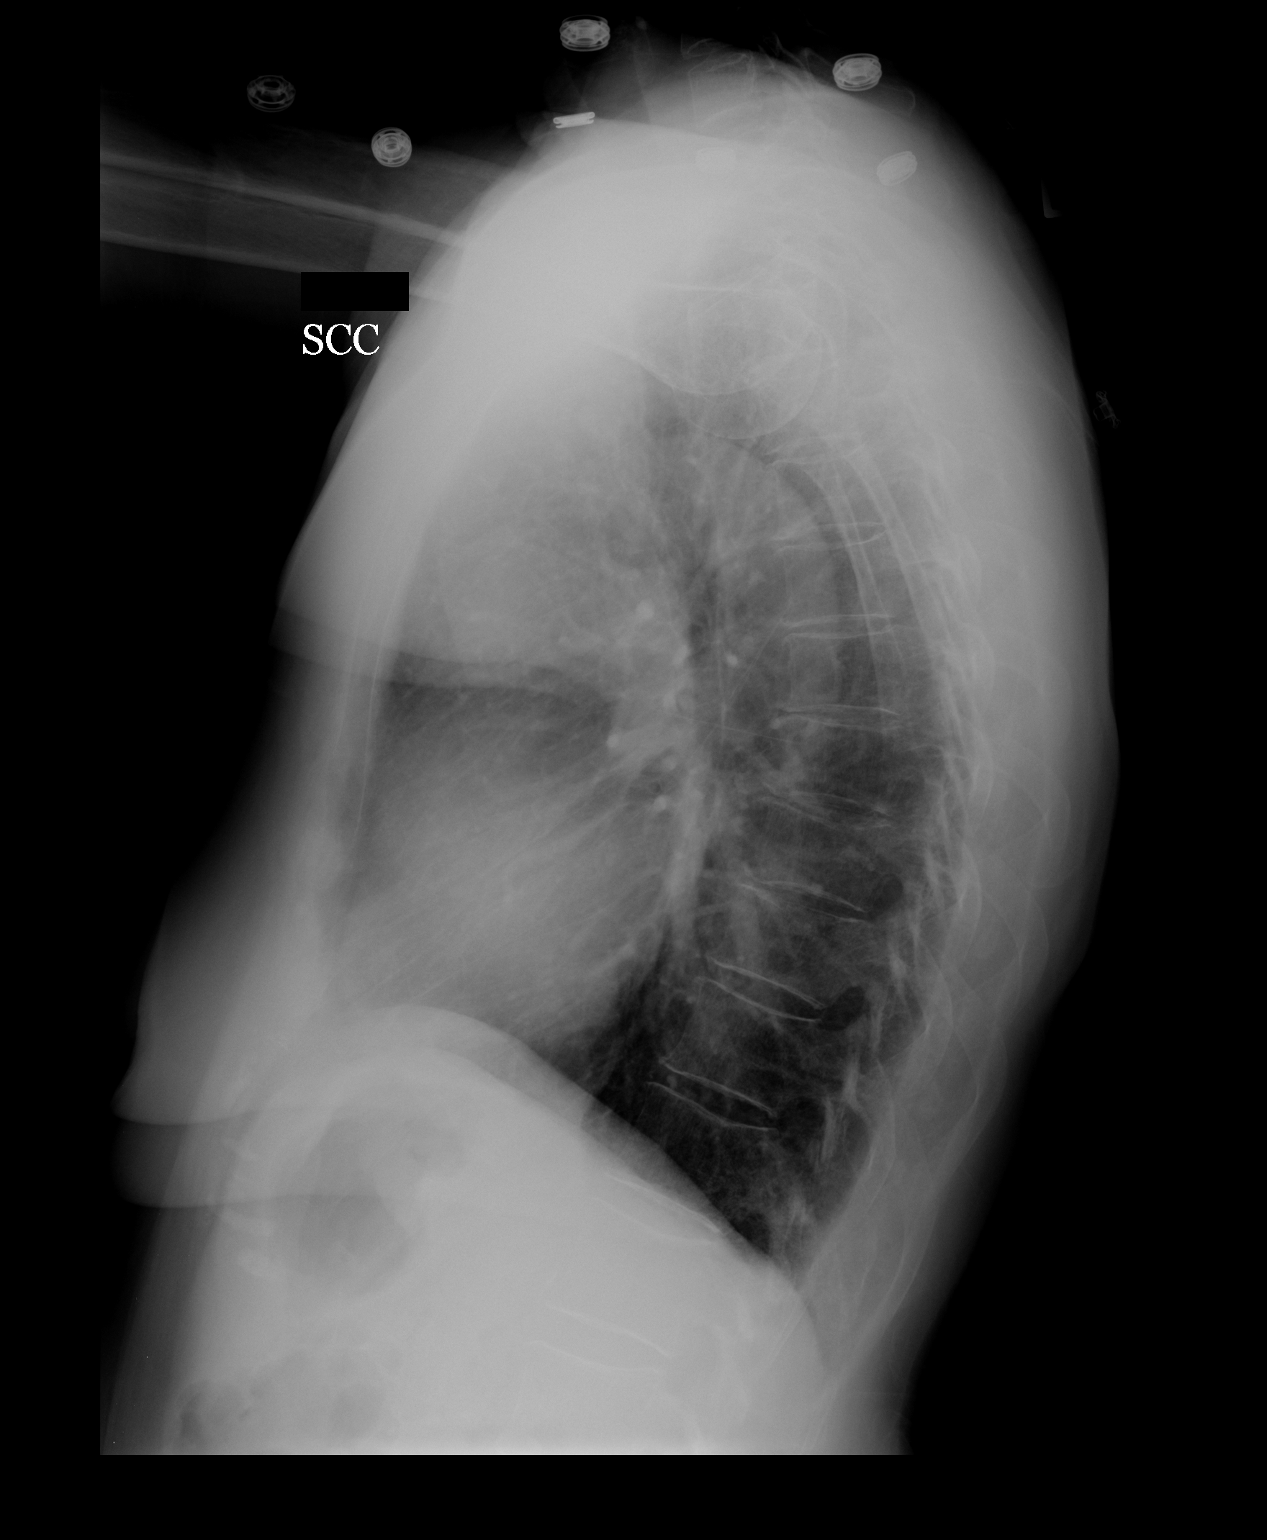

[2 of 2 positions shown; findings below may reference images not displayed]

FINDINGS: Minimal cardiac enlargement.
Mildly tortuous thoracic aorta.
Mediastinal contours and pulmonary vascularity otherwise normal.
Lungs clear.
No pleural effusion or pneumothorax.
Bones diffusely demineralized.
IMPRESSION: Minimal cardiac enlargement.
No acute abnormalities.

## 2009-04-14 ENCOUNTER — Inpatient Hospital Stay (HOSPITAL_COMMUNITY): Admission: RE | Admit: 2009-04-14 | Discharge: 2009-04-17 | Payer: Self-pay | Admitting: *Deleted

## 2009-04-14 IMAGING — CR DG LUMBAR SPINE COMPLETE 4+V
1 series · 1 of 1 positions shown · non-contrast
Comparison: None

CLINICAL DATA: Lumbar spondylosis and spinal stenosis.

LUMBAR SPINE - COMPLETE 4+ VIEW

[view not recorded]
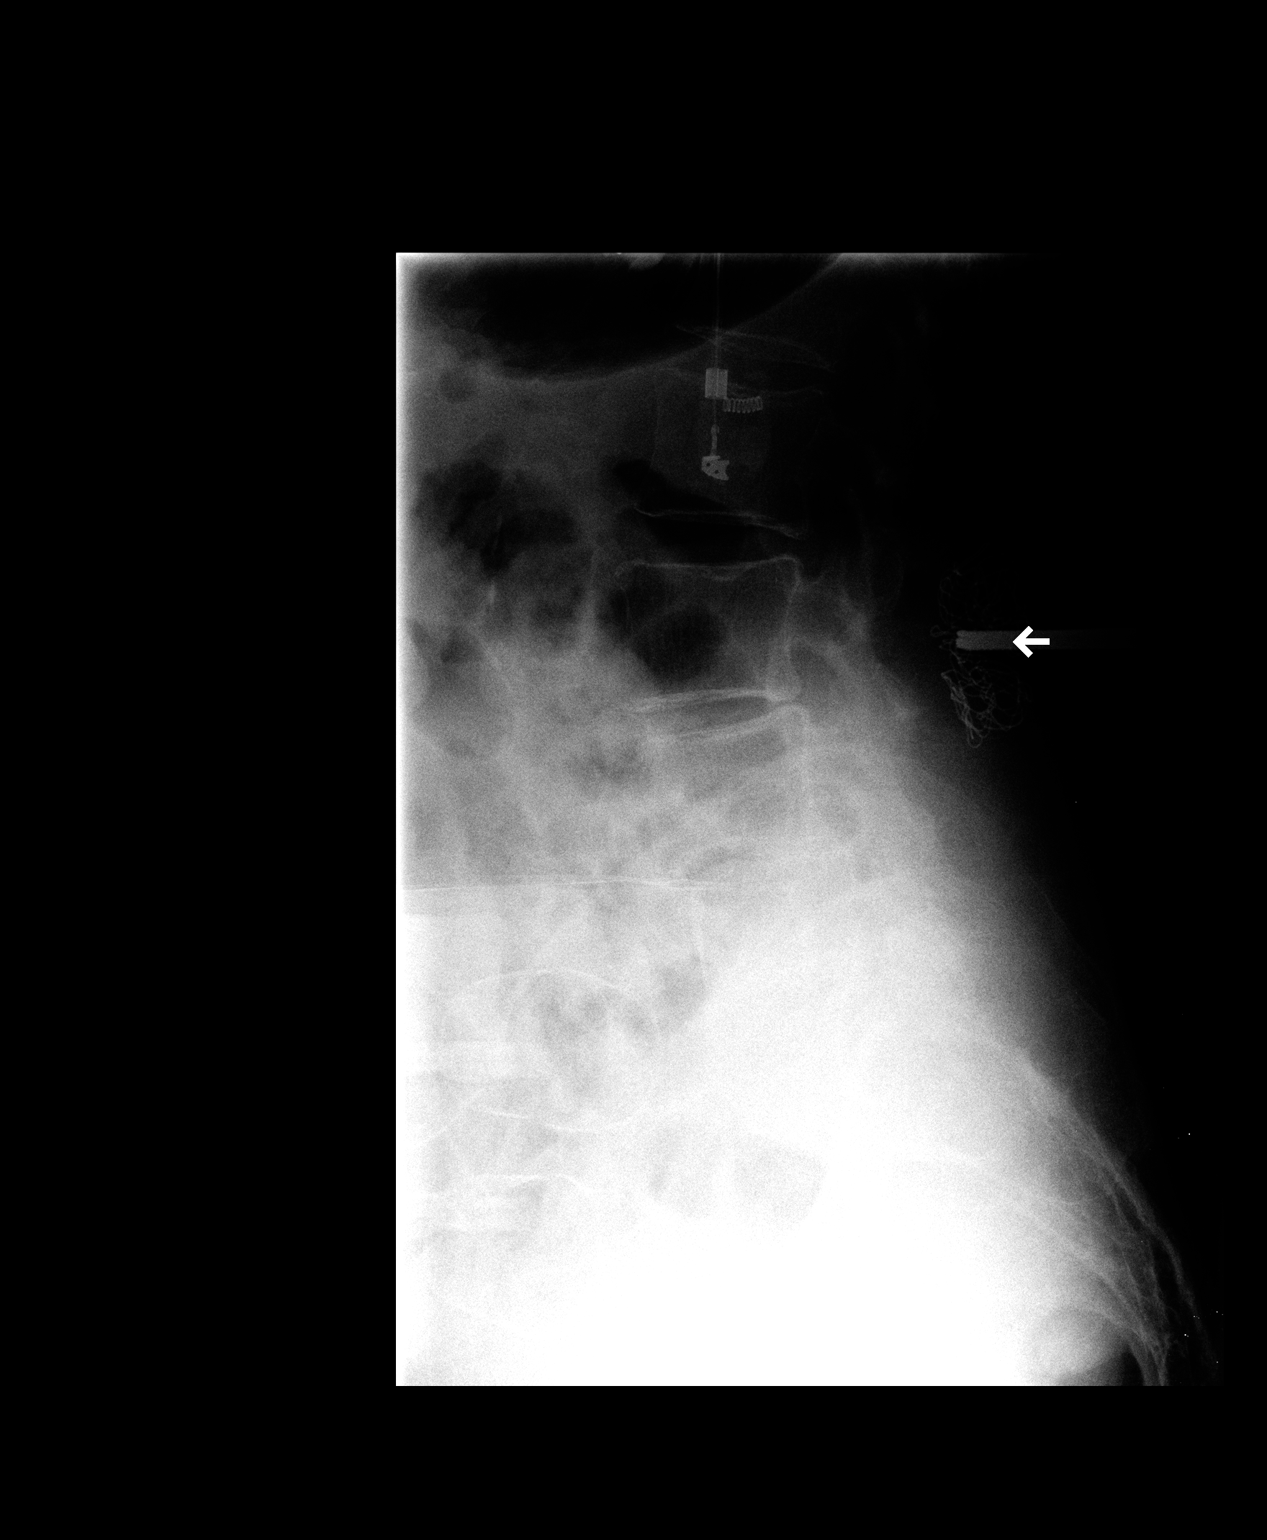

[1 of 1 positions shown; findings below may reference images not displayed]

FINDINGS: Radiograph #1 shows an instrument at the spinous process
of L3.  Radiograph #2 shows an instrument at the L3-4 level.
Radiograph #3 shows guide pins at L3, L4, and L5.  Radiograph #4
shows interpedicular screws and posterior rods at L3-4 and L4-5.
Radiograph #6 is a PA view which demonstrates the screws and
posterior rods in position at L3-4 and L4-5.
IMPRESSION: Posterior fusions performed at L3-4 and L4-5.

## 2009-04-14 IMAGING — CR DG LUMBAR SPINE COMPLETE 4+V
1 series · 1 of 1 positions shown · non-contrast
Comparison: None

CLINICAL DATA: Lumbar spondylosis and spinal stenosis.

LUMBAR SPINE - COMPLETE 4+ VIEW

[view not recorded]
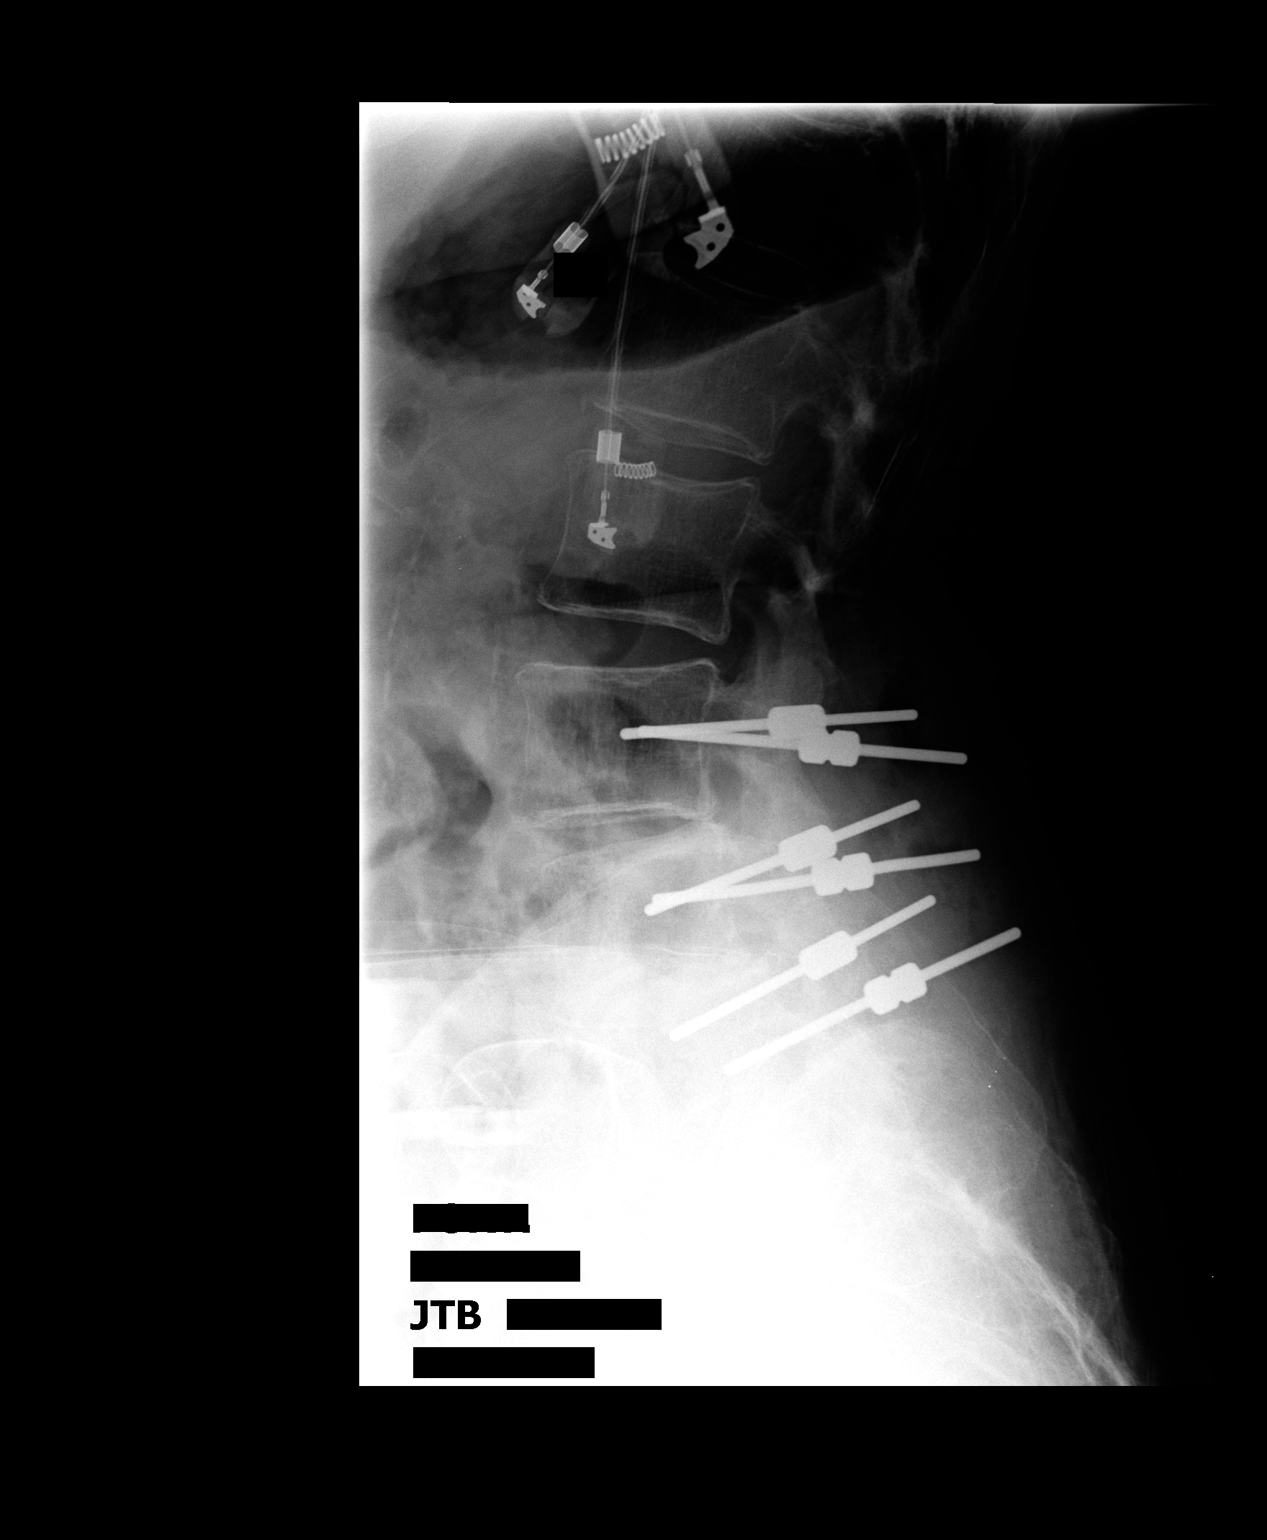

[1 of 1 positions shown; findings below may reference images not displayed]

FINDINGS: Radiograph #1 shows an instrument at the spinous process
of L3.  Radiograph #2 shows an instrument at the L3-4 level.
Radiograph #3 shows guide pins at L3, L4, and L5.  Radiograph #4
shows interpedicular screws and posterior rods at L3-4 and L4-5.
Radiograph #6 is a PA view which demonstrates the screws and
posterior rods in position at L3-4 and L4-5.
IMPRESSION: Posterior fusions performed at L3-4 and L4-5.

## 2009-04-14 IMAGING — CR DG LUMBAR SPINE COMPLETE 4+V
2 series · 2 of 2 positions shown · non-contrast
Comparison: None

CLINICAL DATA: Lumbar spondylosis and spinal stenosis.

LUMBAR SPINE - COMPLETE 4+ VIEW

[view not recorded (1 of 2)]
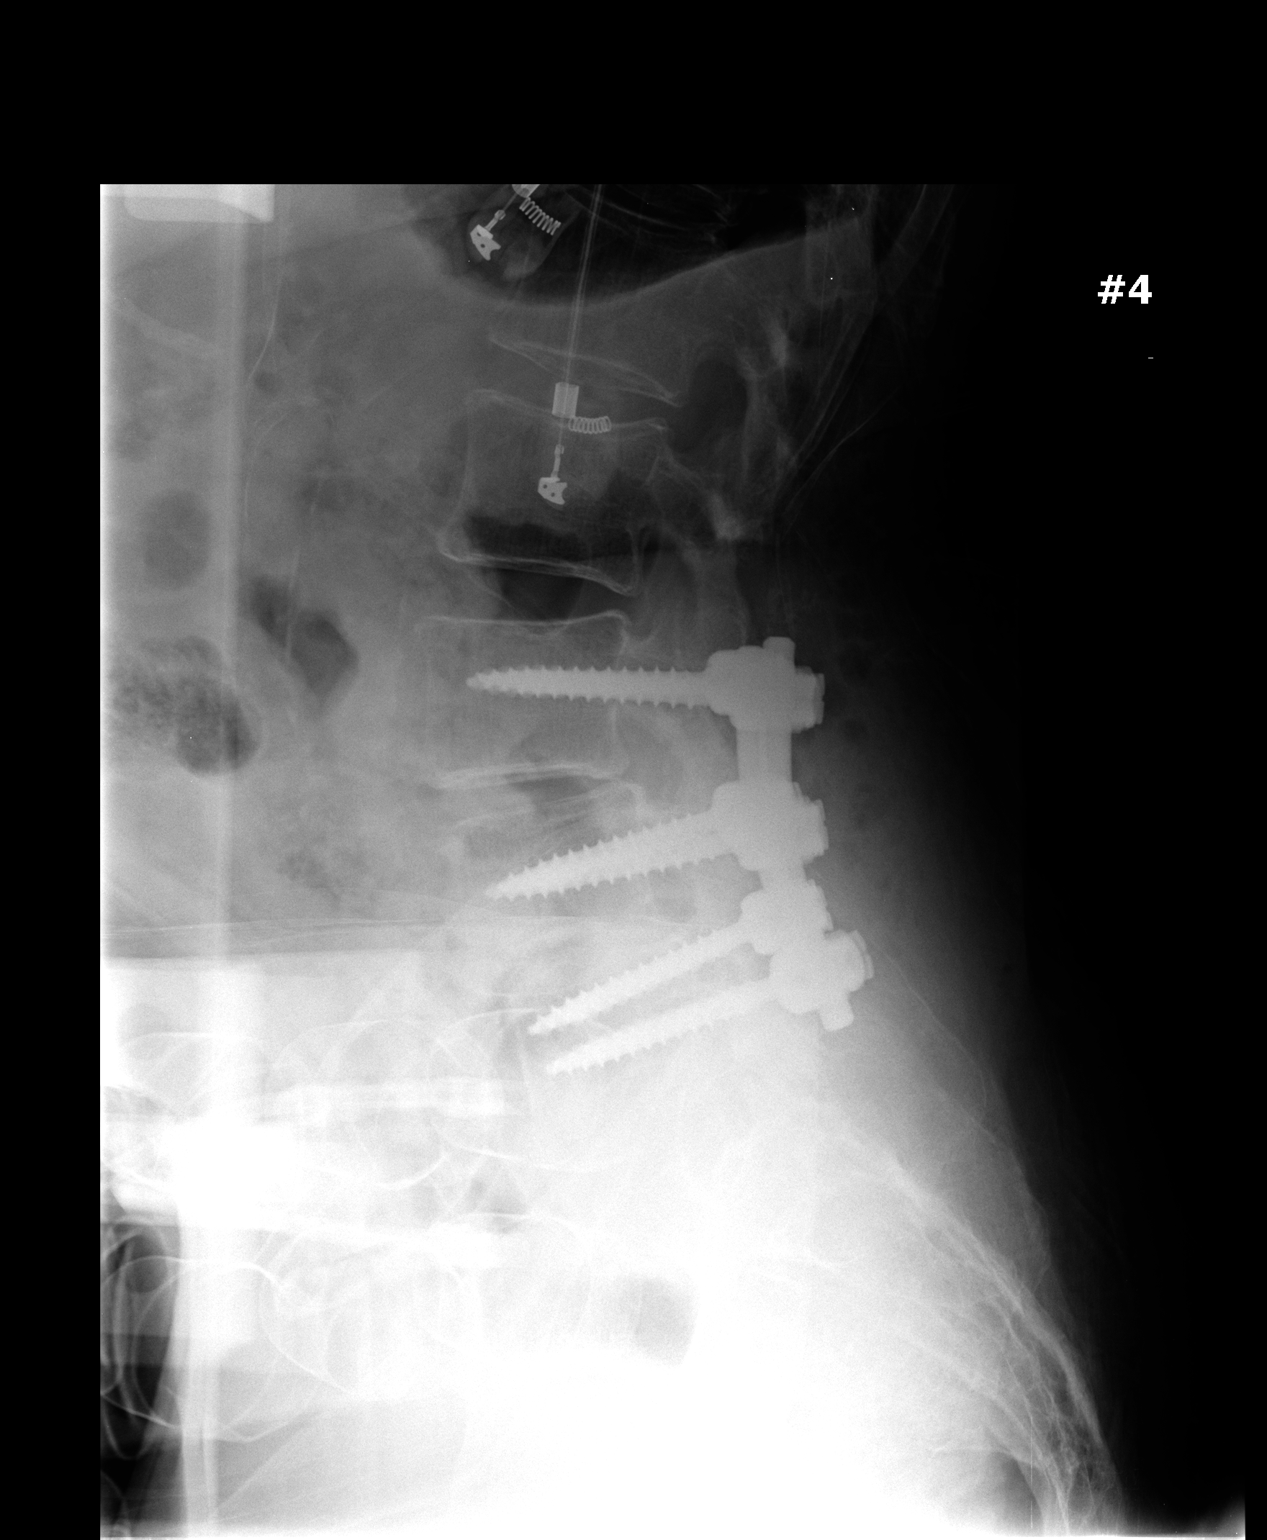

[view not recorded (2 of 2)]
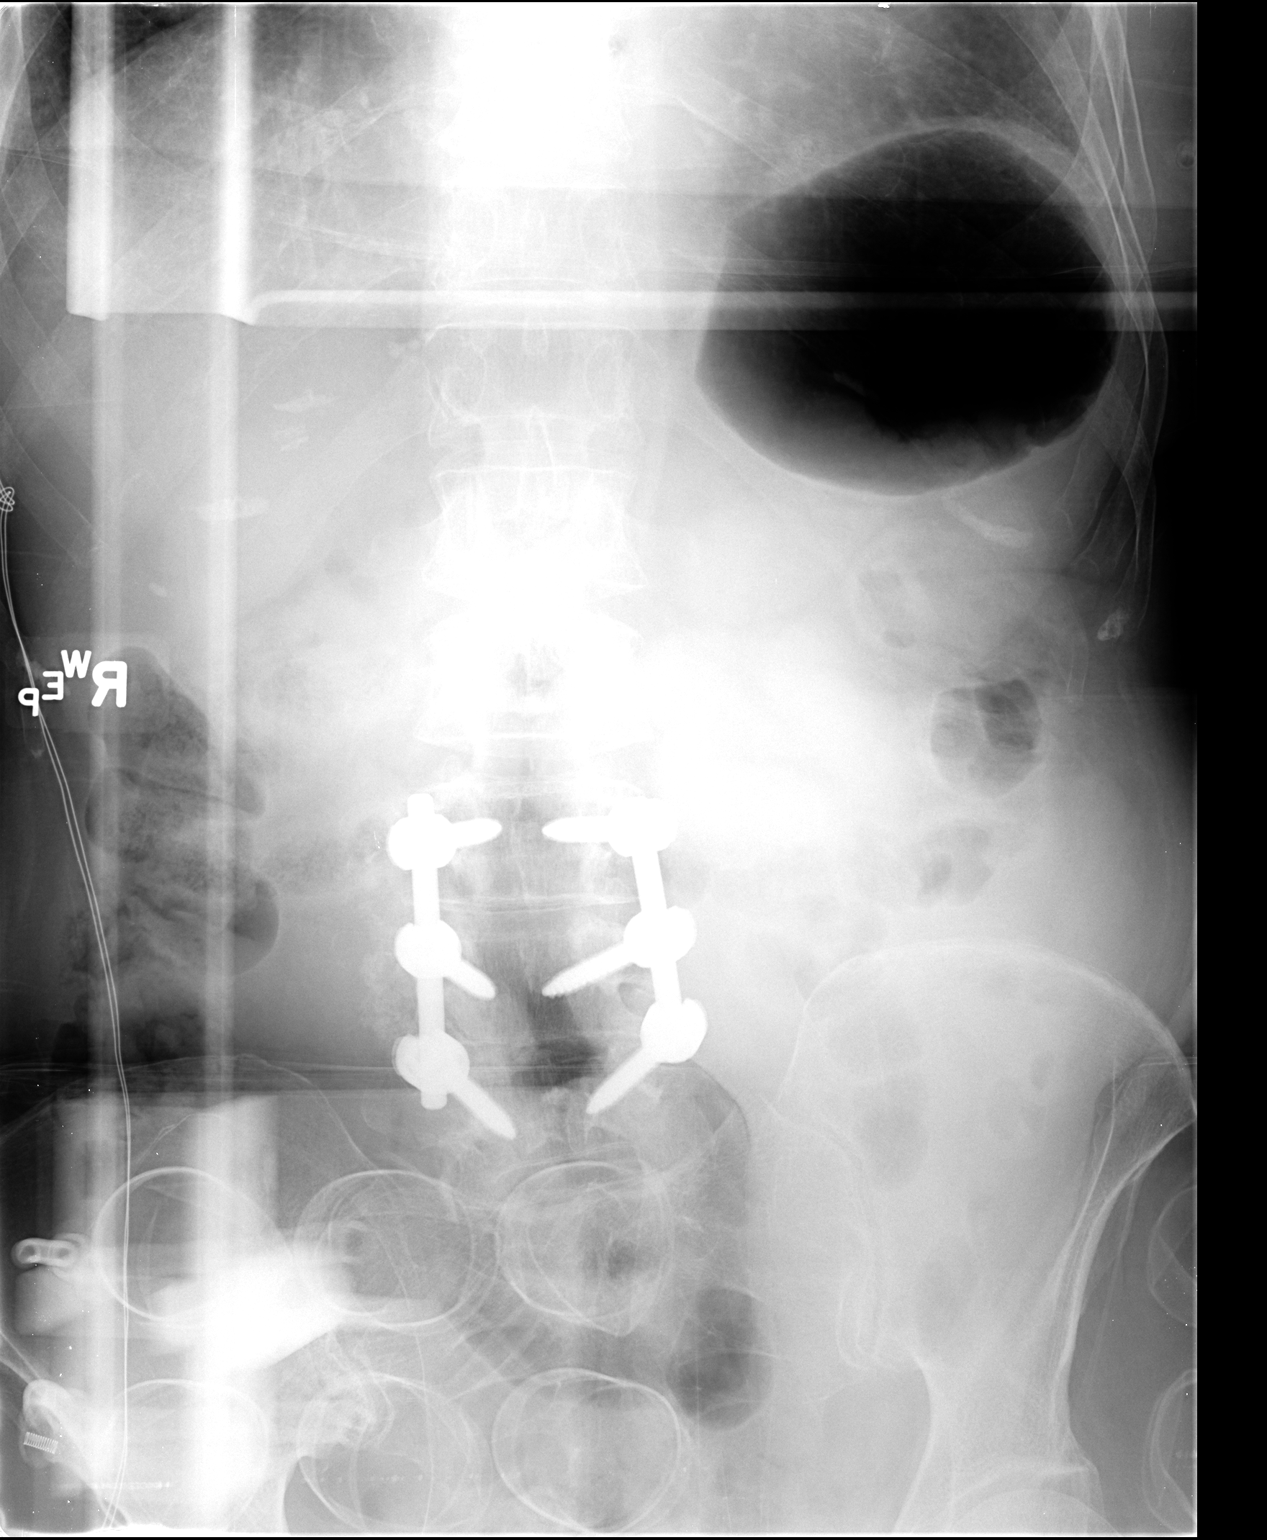

[2 of 2 positions shown; findings below may reference images not displayed]

FINDINGS: Radiograph #1 shows an instrument at the spinous process
of L3.  Radiograph #2 shows an instrument at the L3-4 level.
Radiograph #3 shows guide pins at L3, L4, and L5.  Radiograph #4
shows interpedicular screws and posterior rods at L3-4 and L4-5.
Radiograph #6 is a PA view which demonstrates the screws and
posterior rods in position at L3-4 and L4-5.
IMPRESSION: Posterior fusions performed at L3-4 and L4-5.

## 2009-04-14 IMAGING — CR DG LUMBAR SPINE COMPLETE 4+V
1 series · 1 of 1 positions shown · non-contrast
Comparison: None

CLINICAL DATA: Lumbar spondylosis and spinal stenosis.

LUMBAR SPINE - COMPLETE 4+ VIEW

[view not recorded]
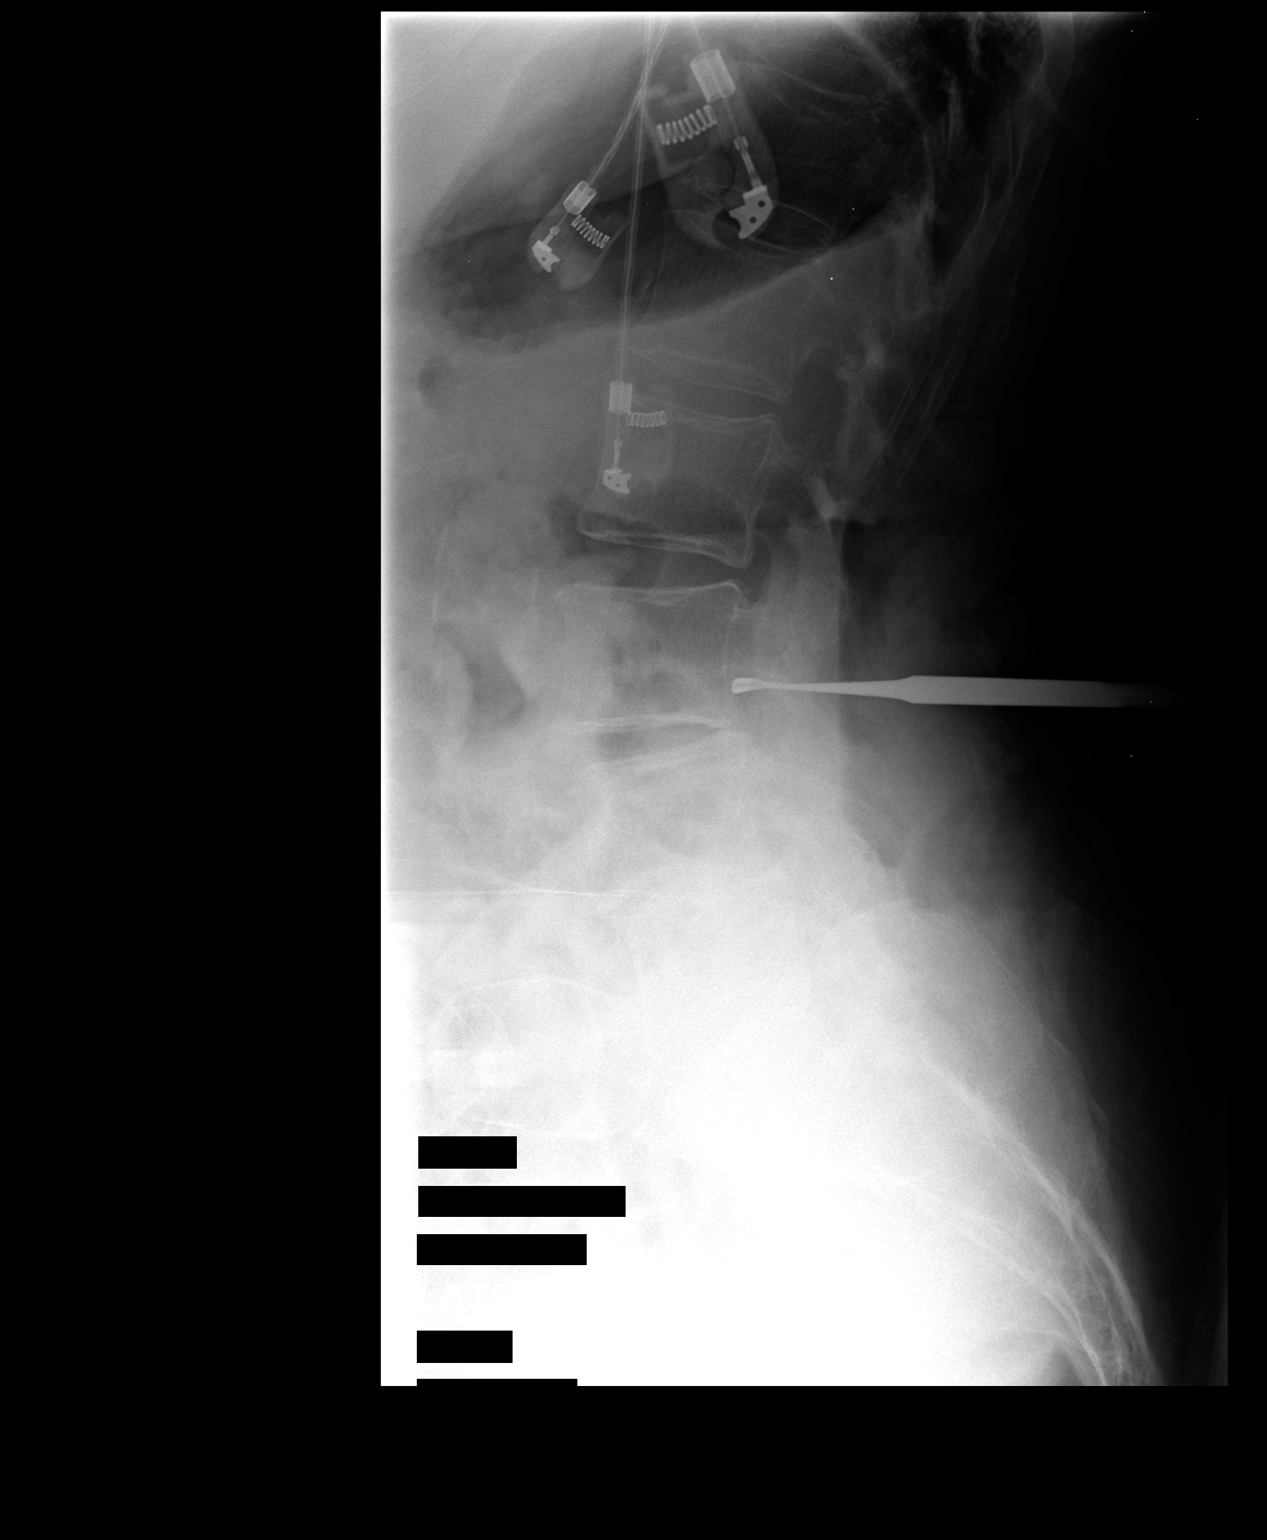

[1 of 1 positions shown; findings below may reference images not displayed]

FINDINGS: Radiograph #1 shows an instrument at the spinous process
of L3.  Radiograph #2 shows an instrument at the L3-4 level.
Radiograph #3 shows guide pins at L3, L4, and L5.  Radiograph #4
shows interpedicular screws and posterior rods at L3-4 and L4-5.
Radiograph #6 is a PA view which demonstrates the screws and
posterior rods in position at L3-4 and L4-5.
IMPRESSION: Posterior fusions performed at L3-4 and L4-5.

## 2010-08-22 ENCOUNTER — Encounter
Admission: RE | Admit: 2010-08-22 | Discharge: 2010-08-22 | Payer: Self-pay | Source: Home / Self Care | Attending: Orthopedic Surgery | Admitting: Orthopedic Surgery

## 2010-08-22 IMAGING — CT CT L SPINE W/ CM
3 of 9 series · 10 of 27 positions shown, 11 images · IV contrast (omnipaque)
Comparison: None available

Clincal data: Previous lumbar fusion.  Recurrent low back pain.

LUMBAR MYELOGRAM
CT LUMBAR SPINE WITH INTRATHECAL CONTRAST
TECHNIQUE: An appropriate entry site was determined under
fluoroscopy. Skin site was marked, prepped with Betadine, and
draped in usual sterile fashion, and infiltrated locally with 1%
lidocaine. A 22 gauge spinal needle was  advanced into the thecal
sac at L3-4 midline approach. Clear colorless CSF returned. 17 ml
Omnipaque 180 were administered intrathecally for lumbar
myelography, followed by axial CT scanning of the lumbar spine.
Coronal and sagittal reconstructions were generated from the axial
scan.

[Series 2: l spine bone · axial · 0.27mm/px · z∈[+28,+88]mm · 2 of 73 slices shown, 3 images]
[im 25/73  soft-tissue]
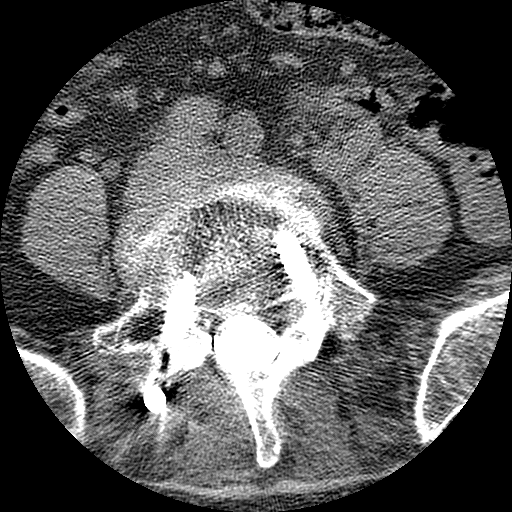
[im 25/73  bone]
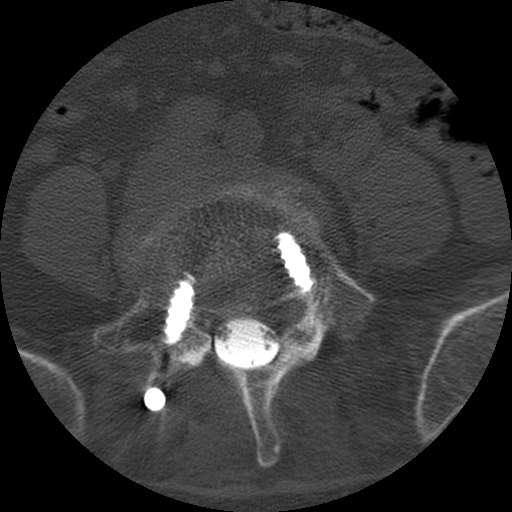
[im 49/73  bone]
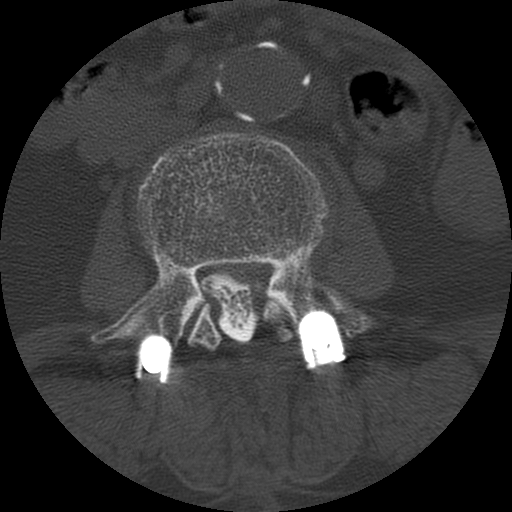

[Series 3: l spine soft · axial · 0.27mm/px · z∈[+12,+102]mm · 3 of 73 slices shown]
[im 19/73  soft-tissue]
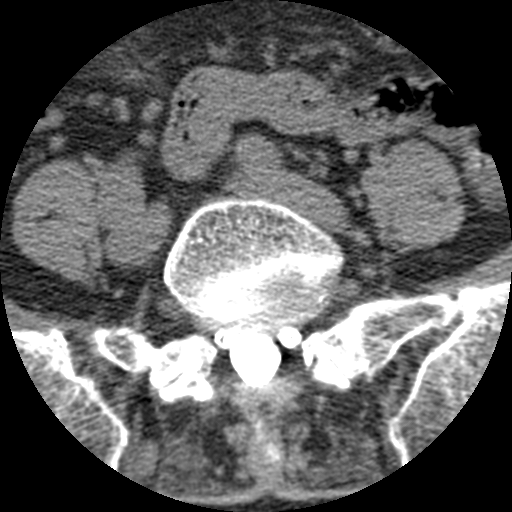
[im 37/73  soft-tissue]
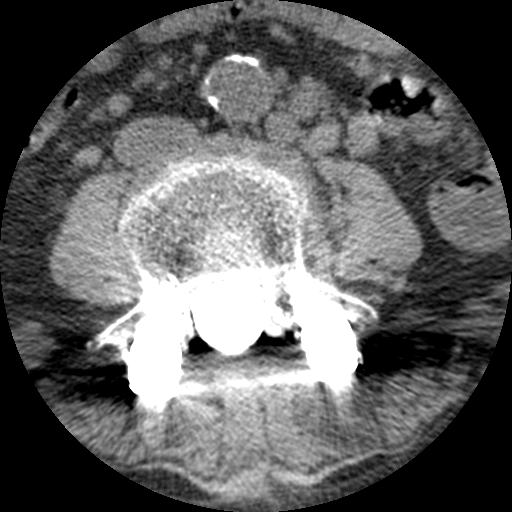
[im 55/73  soft-tissue]
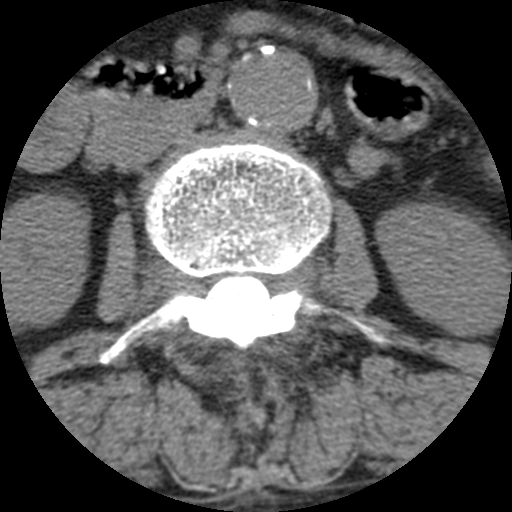

[Series 400: cor · coronal · 0.36mm/px · 5 of 50 slices shown]
[im 9/50  bone]
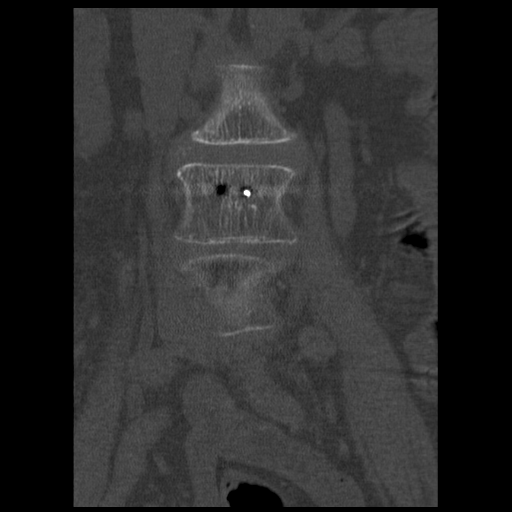
[im 17/50  bone]
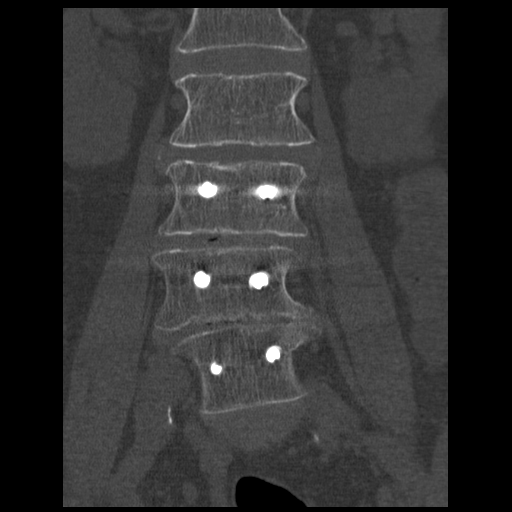
[im 25/50  bone]
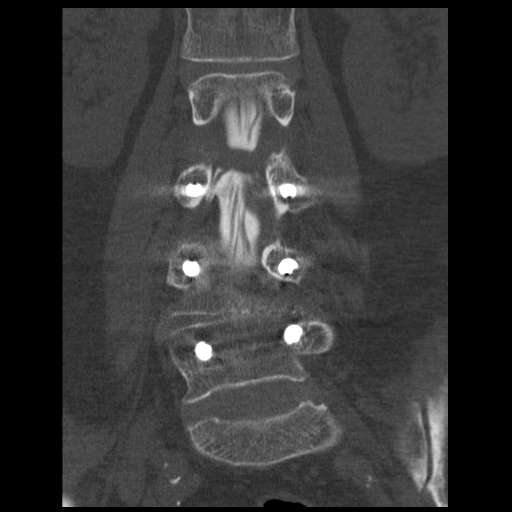
[im 33/50  bone]
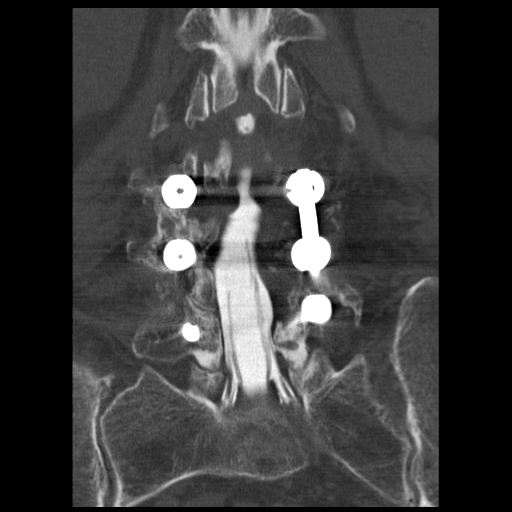
[im 41/50  bone]
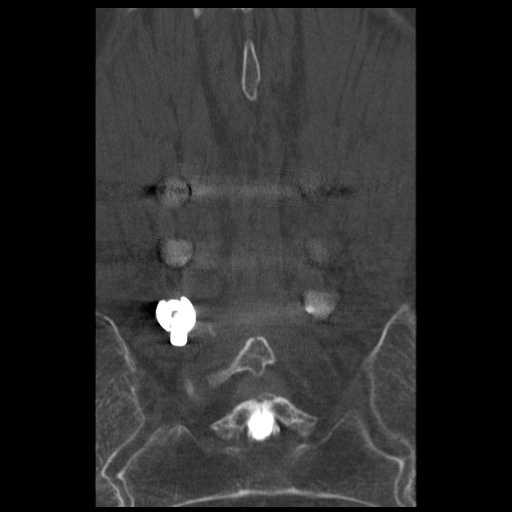

[10 of 27 positions shown; findings below may reference images not displayed]

FINDINGS: There are five non-rib bearing lumbar segments labeled
L1-L5.

L1-2: Normal conus behind the interspace.  Minimal disc bulge.
Central canal and neural foramina widely patent.

L2-3: Moderate circumferential protrusion, partially calcified.
Extruded fragment extending caudad behind the L3 vertebral body to
the left of midline.  The fragment significantly displaces
posteriorly   the left L3 nerve root sleeve, with myelographic cut
off.

L3-4: Bilateral pedicle screws at each level with vertical
interconnecting rods.  Hardware intact without surrounding lucency.
There is narrowing of the L3-4 interspace with vacuum phenomenon.
There is solid appearing fusion across the posterior elements on
the left.

L4-5: Continuation of the posterolateral instrumented fusion,
hardware intact without surrounding lucency.  There is solid
appearing fusion across posterior elements on the left.  Mild
central vacuum phenomenon.

L5-S1: Mild circumferential disc bulge.  Central canal and foramina
widely patent.  Mild bilateral facet degenerative hypertrophy.

Scattered patchy aortoiliac arterial calcifications.  Tortuous
dilated left retroperitoneal venous channels are noted.
IMPRESSION: 1.  Minimal disc bulge L1-2 without compressive pathology.
2.  Moderate disc protrusion L2-3 with moderate left caudal
extrusion, with cut off of the left L3 nerve root sleeve.
3.  Changes of instrumented fusion L3-4 and L4-5 without apparent
complication.

4.  Mild bulge L5-S1 without compressive pathology.

## 2010-08-22 IMAGING — RF DG MYELOGRAM LUMBAR
14 of 16 series · 14 of 16 positions shown · IV contrast (omnipaque)
Comparison: None available

Clincal data: Previous lumbar fusion.  Recurrent low back pain.

LUMBAR MYELOGRAM
CT LUMBAR SPINE WITH INTRATHECAL CONTRAST
TECHNIQUE: An appropriate entry site was determined under
fluoroscopy. Skin site was marked, prepped with Betadine, and
draped in usual sterile fashion, and infiltrated locally with 1%
lidocaine. A 22 gauge spinal needle was  advanced into the thecal
sac at L3-4 midline approach. Clear colorless CSF returned. 17 ml
Omnipaque 180 were administered intrathecally for lumbar
myelography, followed by axial CT scanning of the lumbar spine.
Coronal and sagittal reconstructions were generated from the axial
scan.

[Series 1: (hospital) · 1 of 1 slices shown]
[im 1/1]
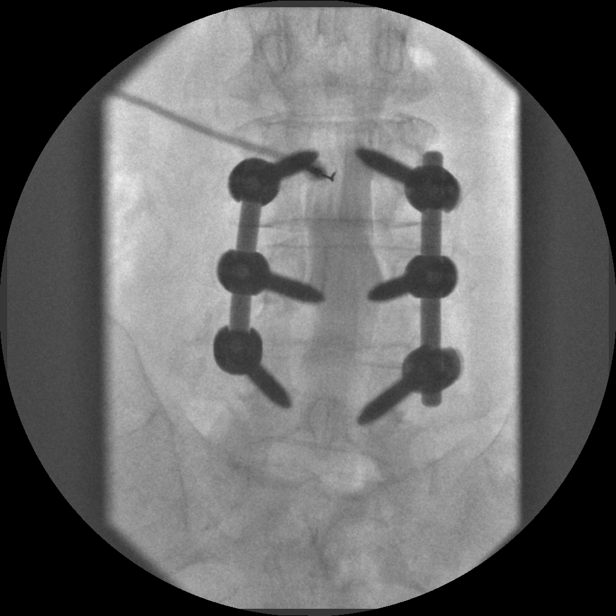

[Series 2: myelogram  white · 1 of 1 slices shown (1 of 13)]
[im 1/1]
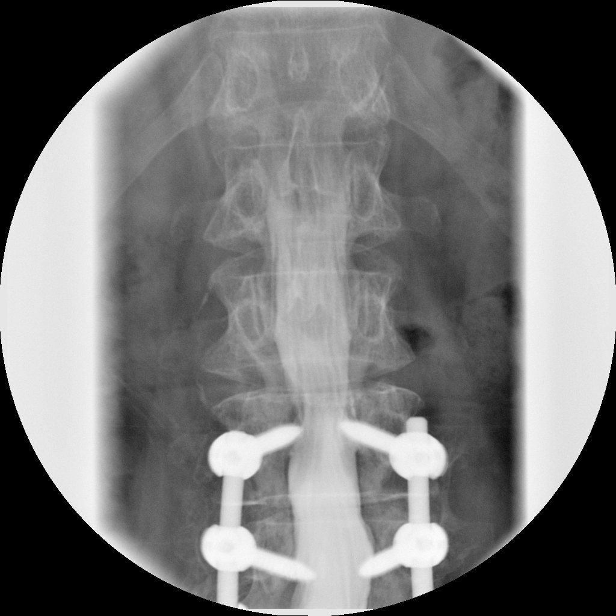

[Series 3: myelogram  white · 1 of 1 slices shown (2 of 13)]
[im 1/1]
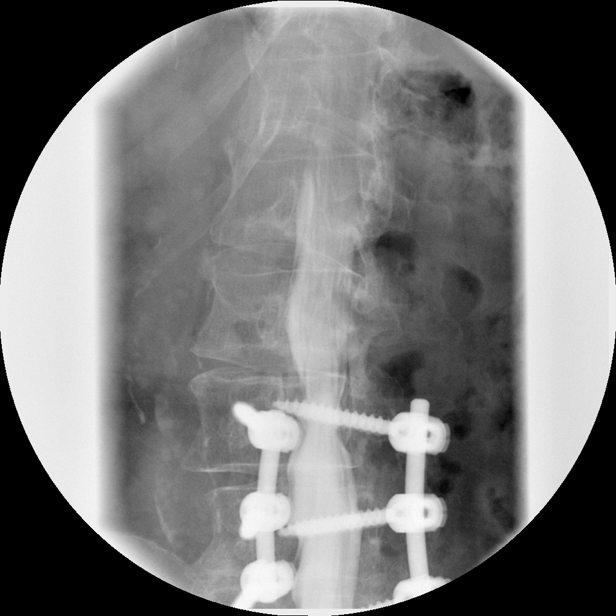

[Series 5: myelogram  white · 1 of 1 slices shown (3 of 13)]
[im 1/1]
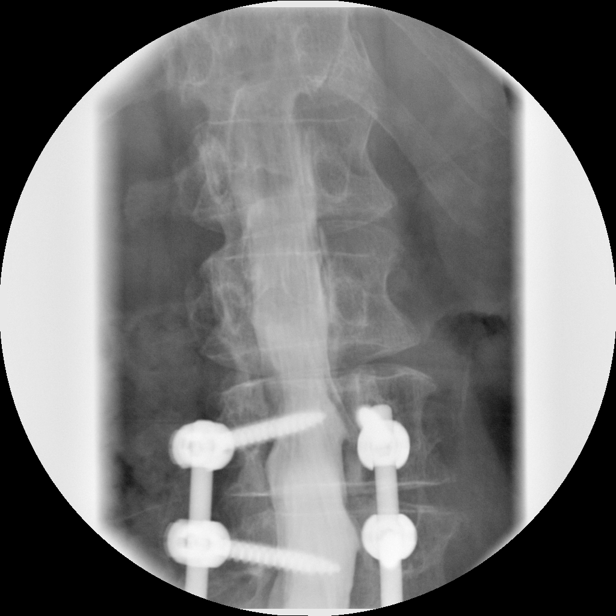

[Series 6: myelogram  white · 1 of 1 slices shown (4 of 13)]
[im 1/1]
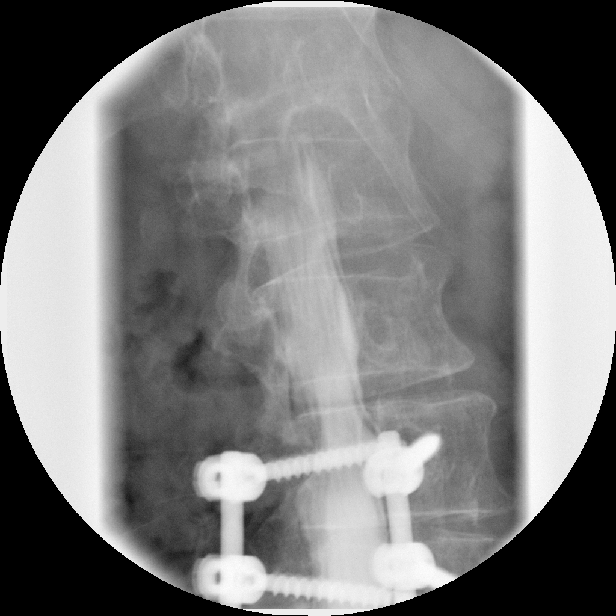

[Series 8: myelogram  white · 1 of 1 slices shown (5 of 13)]
[im 1/1]
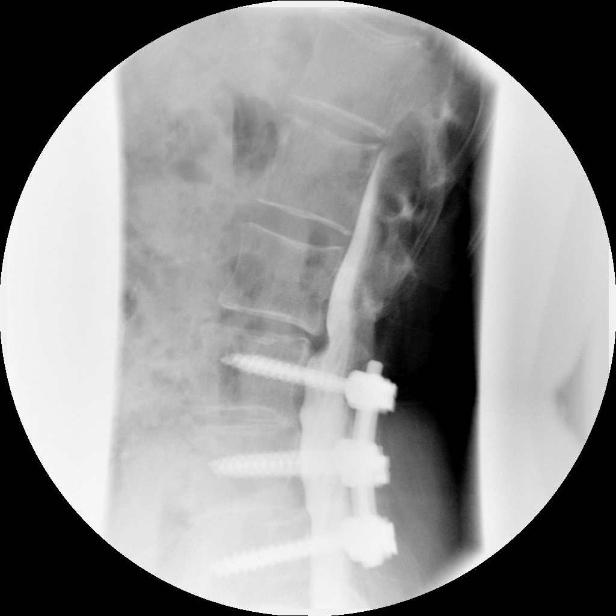

[Series 10: myelogram  white · 1 of 1 slices shown (6 of 13)]
[im 1/1]
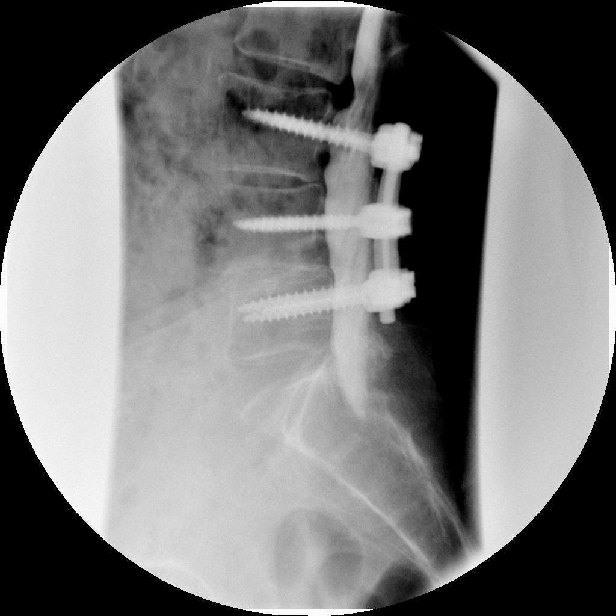

[Series 12: myelogram  white · 1 of 1 slices shown (7 of 13)]
[im 1/1]
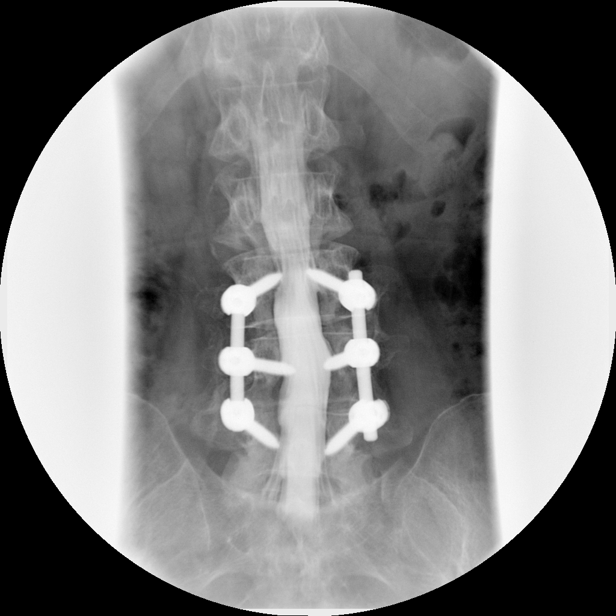

[Series 13: myelogram  white · 1 of 1 slices shown (8 of 13)]
[im 1/1]
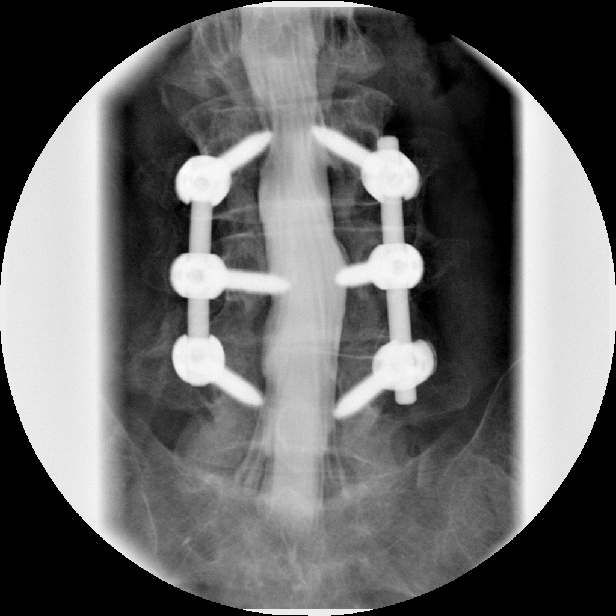

[Series 14: myelogram  white · 1 of 1 slices shown (9 of 13)]
[im 1/1]
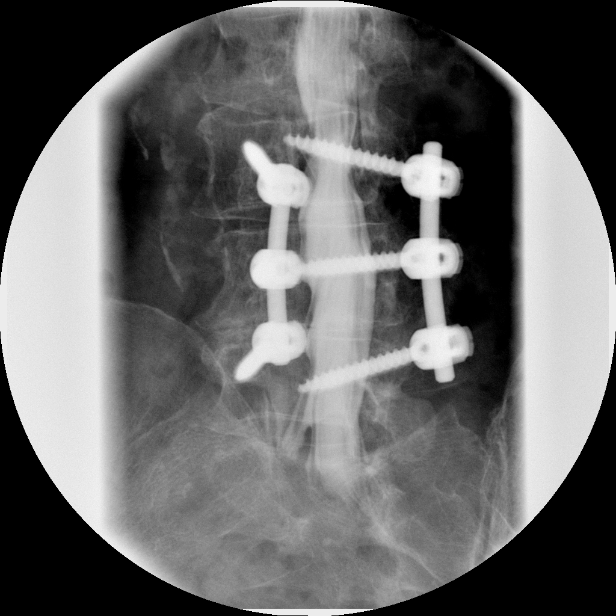

[Series 16: myelogram  white · 1 of 1 slices shown (10 of 13)]
[im 1/1]
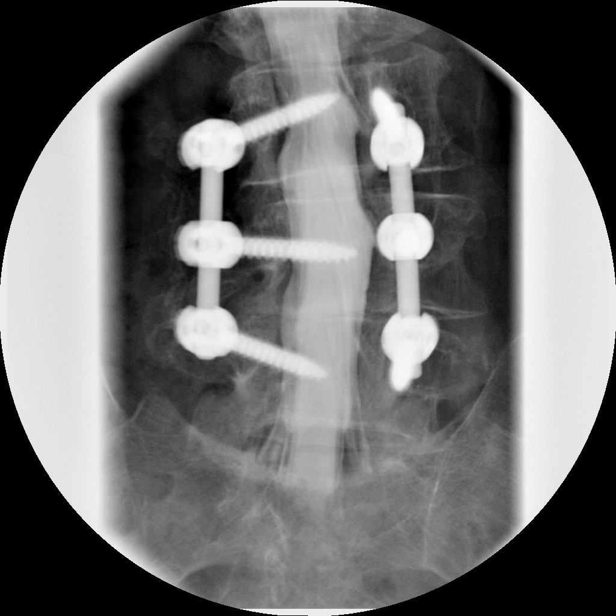

[Series 17: myelogram  white · 1 of 1 slices shown (11 of 13)]
[im 1/1]
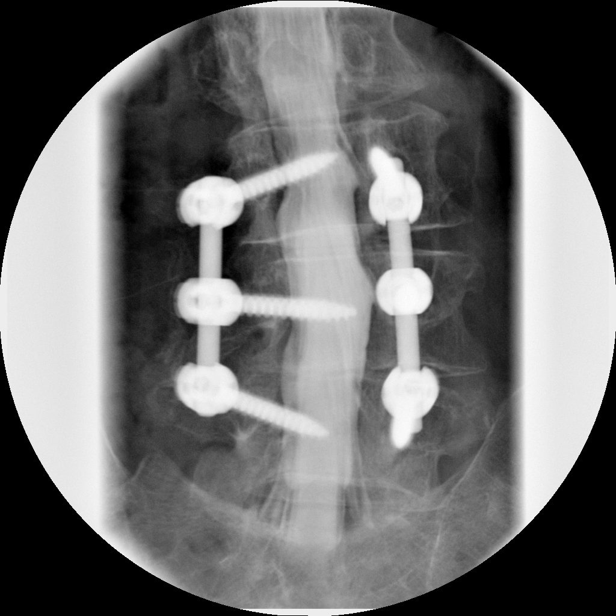

[Series 18: myelogram  white · 1 of 1 slices shown (12 of 13)]
[im 1/1]
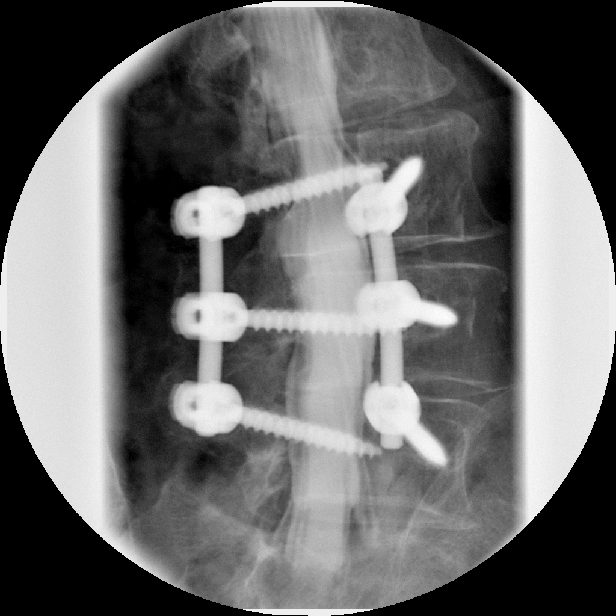

[Series 19: myelogram  white · 1 of 1 slices shown (13 of 13)]
[im 1/1]
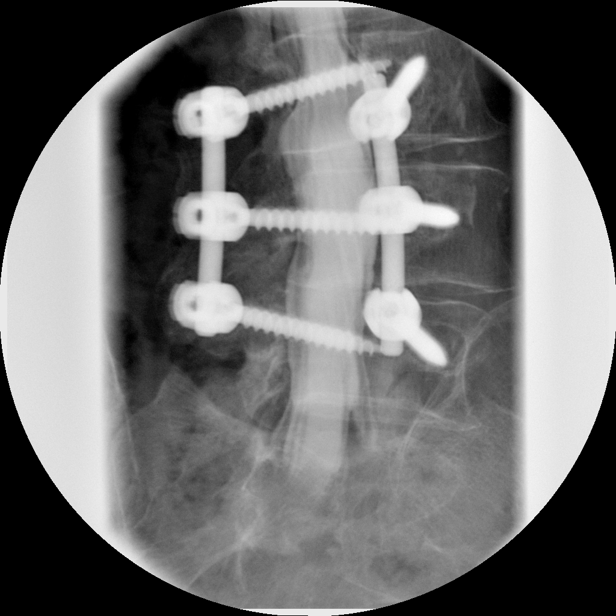

[14 of 16 positions shown; findings below may reference images not displayed]

FINDINGS: There are five non-rib bearing lumbar segments labeled
L1-L5.

L1-2: Normal conus behind the interspace.  Minimal disc bulge.
Central canal and neural foramina widely patent.

L2-3: Moderate circumferential protrusion, partially calcified.
Extruded fragment extending caudad behind the L3 vertebral body to
the left of midline.  The fragment significantly displaces
posteriorly   the left L3 nerve root sleeve, with myelographic cut
off.

L3-4: Bilateral pedicle screws at each level with vertical
interconnecting rods.  Hardware intact without surrounding lucency.
There is narrowing of the L3-4 interspace with vacuum phenomenon.
There is solid appearing fusion across the posterior elements on
the left.

L4-5: Continuation of the posterolateral instrumented fusion,
hardware intact without surrounding lucency.  There is solid
appearing fusion across posterior elements on the left.  Mild
central vacuum phenomenon.

L5-S1: Mild circumferential disc bulge.  Central canal and foramina
widely patent.  Mild bilateral facet degenerative hypertrophy.

Scattered patchy aortoiliac arterial calcifications.  Tortuous
dilated left retroperitoneal venous channels are noted.
IMPRESSION: 1.  Minimal disc bulge L1-2 without compressive pathology.
2.  Moderate disc protrusion L2-3 with moderate left caudal
extrusion, with cut off of the left L3 nerve root sleeve.
3.  Changes of instrumented fusion L3-4 and L4-5 without apparent
complication.

4.  Mild bulge L5-S1 without compressive pathology.

## 2010-08-22 IMAGING — CR DG MYELOGRAM LUMBAR
3 series · 3 of 3 positions shown · IV contrast (omnipaque)
Comparison: None available

Clincal data: Previous lumbar fusion.  Recurrent low back pain.

LUMBAR MYELOGRAM
CT LUMBAR SPINE WITH INTRATHECAL CONTRAST
TECHNIQUE: An appropriate entry site was determined under
fluoroscopy. Skin site was marked, prepped with Betadine, and
draped in usual sterile fashion, and infiltrated locally with 1%
lidocaine. A 22 gauge spinal needle was  advanced into the thecal
sac at L3-4 midline approach. Clear colorless CSF returned. 17 ml
Omnipaque 180 were administered intrathecally for lumbar
myelography, followed by axial CT scanning of the lumbar spine.
Coronal and sagittal reconstructions were generated from the axial
scan.

[view not recorded (1 of 3)]
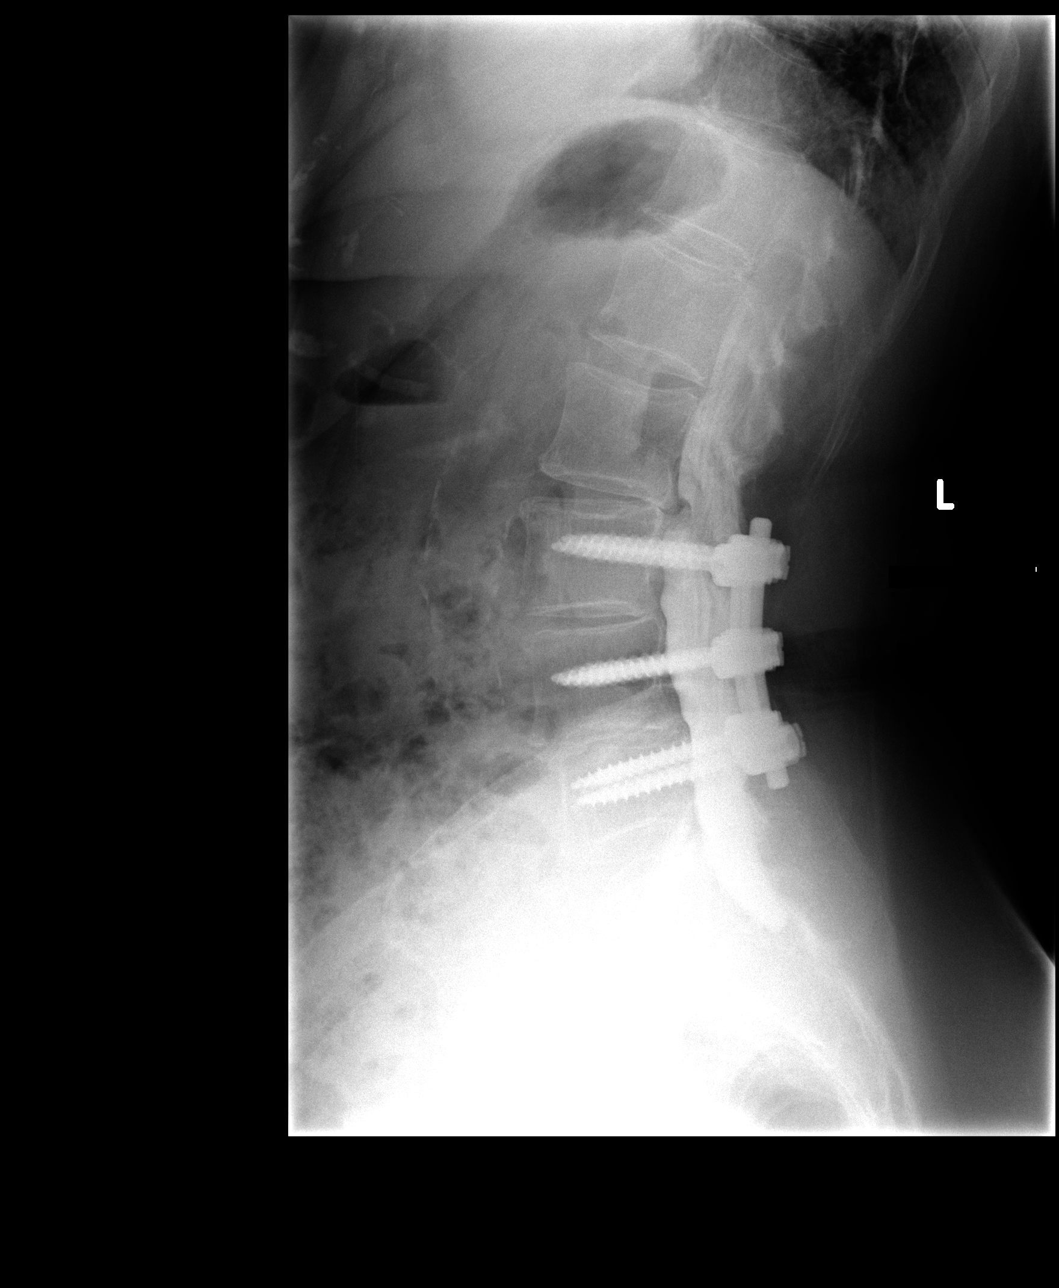

[view not recorded (2 of 3)]
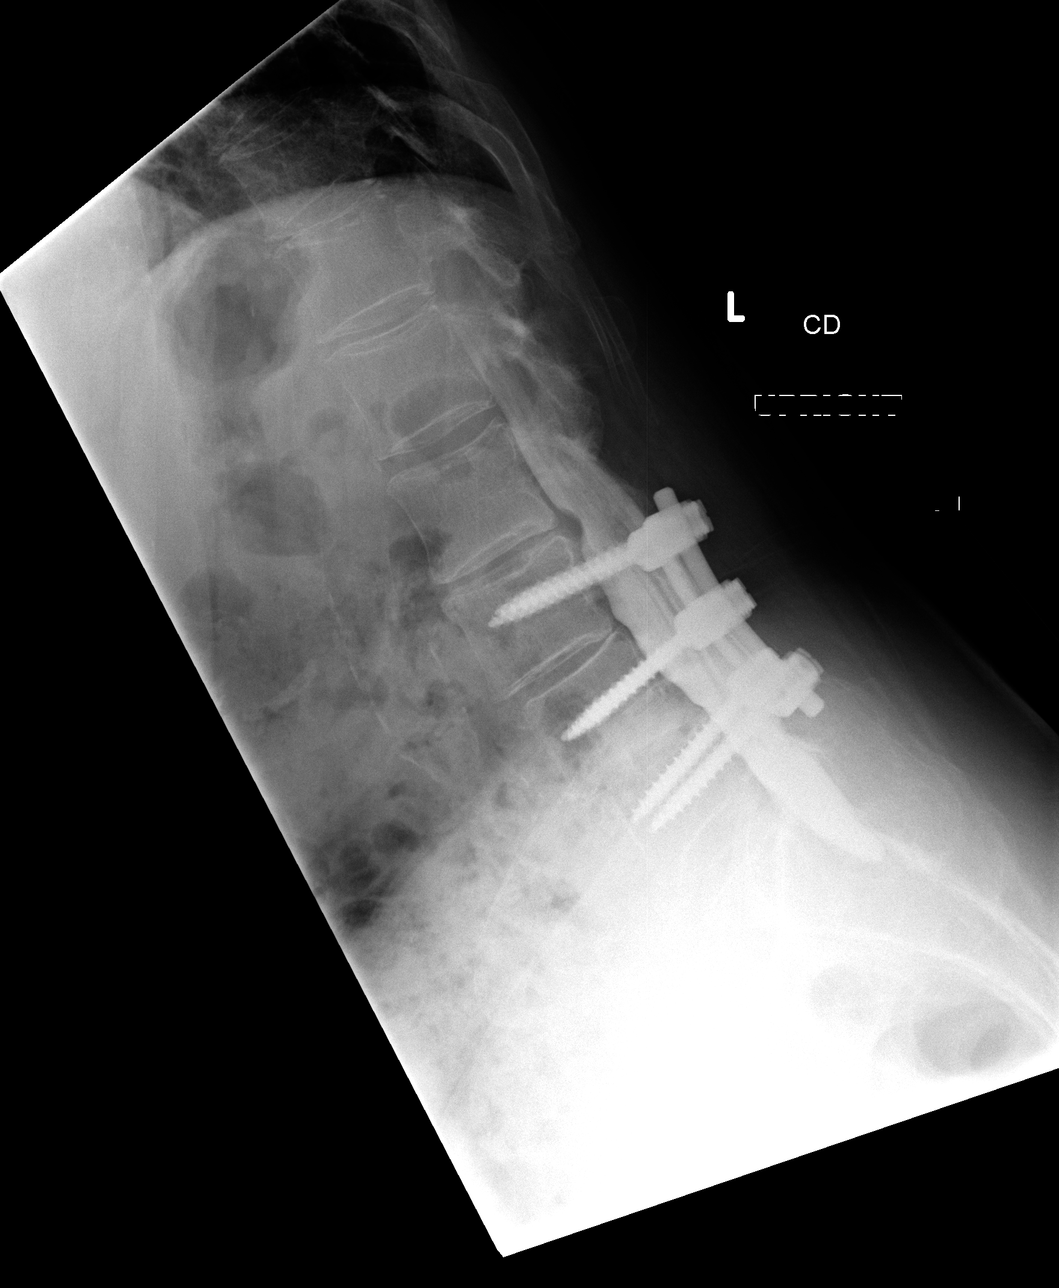

[view not recorded (3 of 3)]
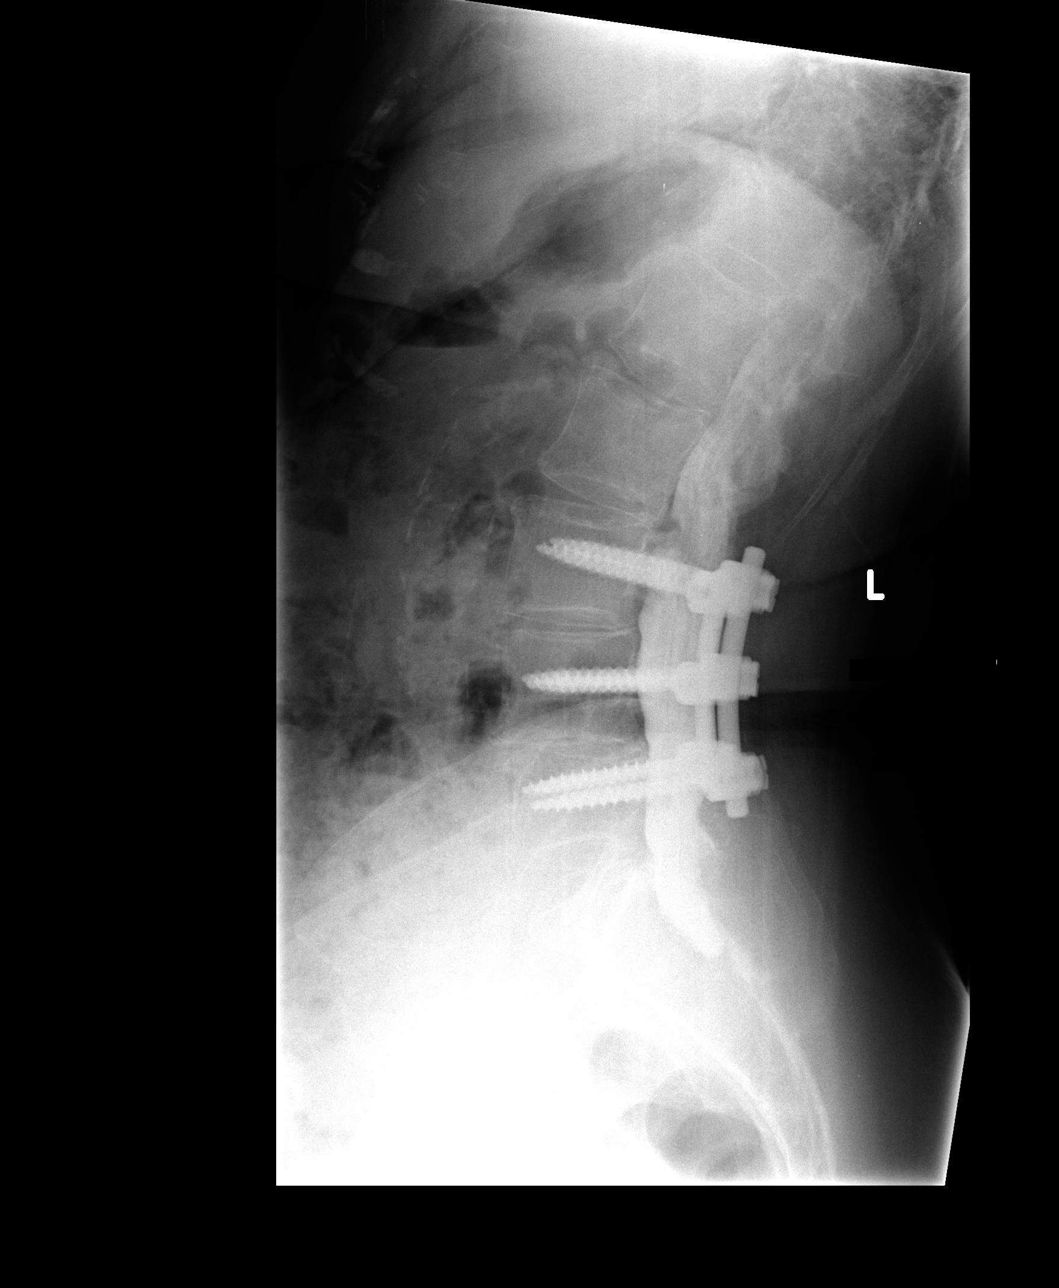

[3 of 3 positions shown; findings below may reference images not displayed]

FINDINGS: There are five non-rib bearing lumbar segments labeled
L1-L5.

L1-2: Normal conus behind the interspace.  Minimal disc bulge.
Central canal and neural foramina widely patent.

L2-3: Moderate circumferential protrusion, partially calcified.
Extruded fragment extending caudad behind the L3 vertebral body to
the left of midline.  The fragment significantly displaces
posteriorly   the left L3 nerve root sleeve, with myelographic cut
off.

L3-4: Bilateral pedicle screws at each level with vertical
interconnecting rods.  Hardware intact without surrounding lucency.
There is narrowing of the L3-4 interspace with vacuum phenomenon.
There is solid appearing fusion across the posterior elements on
the left.

L4-5: Continuation of the posterolateral instrumented fusion,
hardware intact without surrounding lucency.  There is solid
appearing fusion across posterior elements on the left.  Mild
central vacuum phenomenon.

L5-S1: Mild circumferential disc bulge.  Central canal and foramina
widely patent.  Mild bilateral facet degenerative hypertrophy.

Scattered patchy aortoiliac arterial calcifications.  Tortuous
dilated left retroperitoneal venous channels are noted.
IMPRESSION: 1.  Minimal disc bulge L1-2 without compressive pathology.
2.  Moderate disc protrusion L2-3 with moderate left caudal
extrusion, with cut off of the left L3 nerve root sleeve.
3.  Changes of instrumented fusion L3-4 and L4-5 without apparent
complication.

4.  Mild bulge L5-S1 without compressive pathology.

## 2010-10-27 ENCOUNTER — Other Ambulatory Visit (HOSPITAL_COMMUNITY): Payer: Self-pay | Admitting: Orthopedic Surgery

## 2010-10-27 ENCOUNTER — Encounter (HOSPITAL_COMMUNITY)
Admission: RE | Admit: 2010-10-27 | Discharge: 2010-10-27 | Disposition: A | Discharge status: Home / Self Care | Payer: Worker's Compensation | Source: Ambulatory Visit | Attending: Orthopedic Surgery | Admitting: Orthopedic Surgery

## 2010-10-27 ENCOUNTER — Ambulatory Visit (HOSPITAL_COMMUNITY)
Admission: RE | Admit: 2010-10-27 | Discharge: 2010-10-27 | Disposition: A | Discharge status: Home / Self Care | Payer: Worker's Compensation | Source: Ambulatory Visit | Attending: Orthopedic Surgery | Admitting: Orthopedic Surgery

## 2010-10-27 DIAGNOSIS — M79609 Pain in unspecified limb: Secondary | ICD-10-CM | POA: Insufficient documentation

## 2010-10-27 DIAGNOSIS — M79605 Pain in left leg: Secondary | ICD-10-CM

## 2010-10-27 DIAGNOSIS — Z01812 Encounter for preprocedural laboratory examination: Secondary | ICD-10-CM | POA: Insufficient documentation

## 2010-10-27 DIAGNOSIS — Z01810 Encounter for preprocedural cardiovascular examination: Secondary | ICD-10-CM | POA: Insufficient documentation

## 2010-10-27 DIAGNOSIS — Z01818 Encounter for other preprocedural examination: Secondary | ICD-10-CM | POA: Insufficient documentation

## 2010-10-27 LAB — DIFFERENTIAL
Basophils Relative: 1 % (ref 0–1)
Eosinophils Absolute: 0.1 10*3/uL (ref 0.0–0.7)
Eosinophils Relative: 2 % (ref 0–5)
Lymphs Abs: 2.5 10*3/uL (ref 0.7–4.0)
Monocytes Absolute: 0.7 10*3/uL (ref 0.1–1.0)
Monocytes Relative: 10 % (ref 3–12)
Neutrophils Relative %: 55 % (ref 43–77)

## 2010-10-27 LAB — SURGICAL PCR SCREEN
MRSA, PCR: NEGATIVE
Staphylococcus aureus: POSITIVE — AB

## 2010-10-27 LAB — CBC
Hemoglobin: 14.1 g/dL (ref 12.0–15.0)
MCH: 28 pg (ref 26.0–34.0)
MCHC: 33.7 g/dL (ref 30.0–36.0)
MCV: 83.3 fL (ref 78.0–100.0)
Platelets: 209 10*3/uL (ref 150–400)
RBC: 5.03 MIL/uL (ref 3.87–5.11)
RDW: 13.6 % (ref 11.5–15.5)
WBC: 7.5 10*3/uL (ref 4.0–10.5)

## 2010-10-27 LAB — URINALYSIS, ROUTINE W REFLEX MICROSCOPIC
Hgb urine dipstick: NEGATIVE
Ketones, ur: NEGATIVE mg/dL
Nitrite: NEGATIVE
Protein, ur: NEGATIVE mg/dL
Urobilinogen, UA: 0.2 mg/dL (ref 0.0–1.0)

## 2010-10-27 LAB — COMPREHENSIVE METABOLIC PANEL
ALT: 14 U/L (ref 0–35)
AST: 22 U/L (ref 0–37)
Albumin: 4.2 g/dL (ref 3.5–5.2)
Alkaline Phosphatase: 81 U/L (ref 39–117)
BUN: 8 mg/dL (ref 6–23)
CO2: 30 mEq/L (ref 19–32)
Chloride: 98 mEq/L (ref 96–112)
Creatinine, Ser: 0.73 mg/dL (ref 0.4–1.2)
GFR calc Af Amer: >60 mL/min (ref >60)
GFR calc non Af Amer: >60 mL/min (ref >60)
Glucose, Bld: 120 mg/dL — ABNORMAL HIGH (ref 70–99)
Potassium: 3.5 mEq/L (ref 3.5–5.1)
Total Bilirubin: 0.7 mg/dL (ref 0.3–1.2)
Total Protein: 6.9 g/dL (ref 6.0–8.3)

## 2010-10-27 LAB — APTT: aPTT: 32 seconds (ref 24–37)

## 2010-10-27 LAB — PROTIME-INR
INR: 0.93 (ref 0.00–1.49)
Prothrombin Time: 12.7 seconds (ref 11.6–15.2)

## 2010-10-27 IMAGING — CR DG CHEST 2V
2 series · 2 of 2 positions shown · non-contrast
Comparison: [DATE] and earlier.

CLINICAL DATA: 69-year-old female preoperative study.
Hypertension.  Left leg pain.

CHEST - 2 VIEW

[view not recorded (1 of 2)]
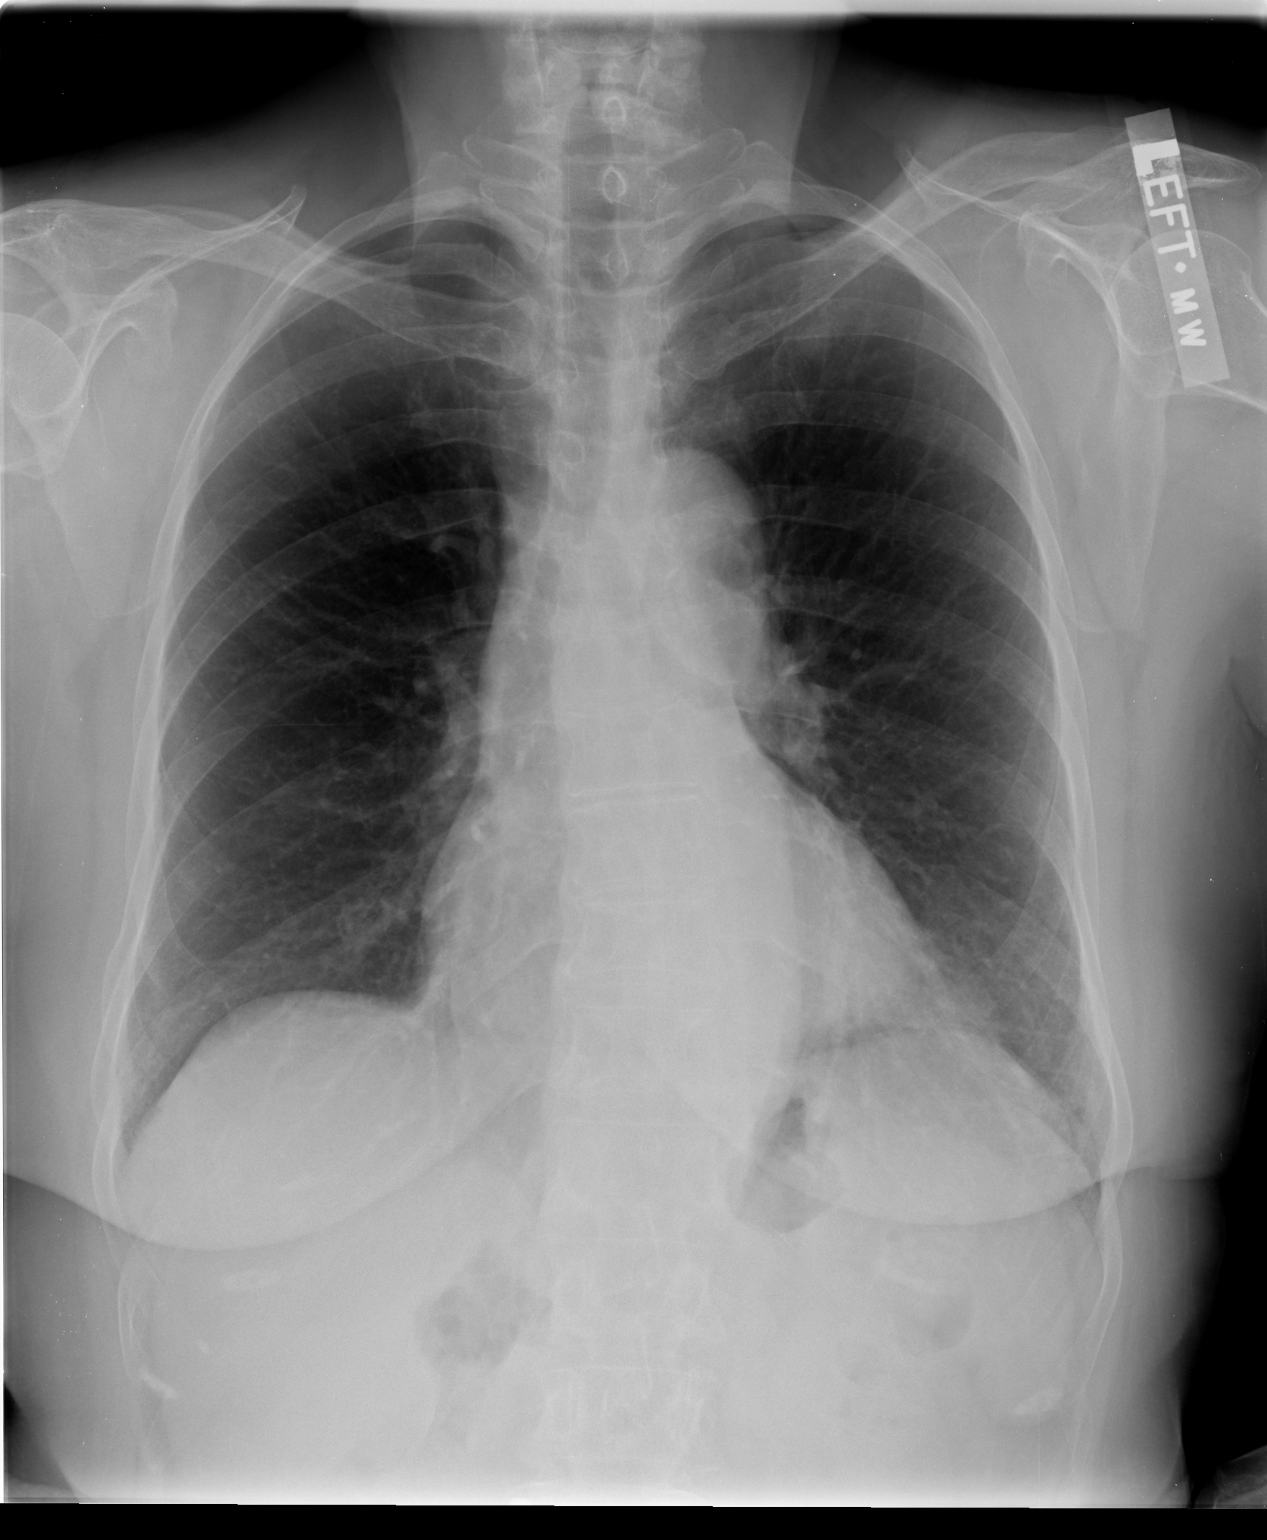

[view not recorded (2 of 2)]
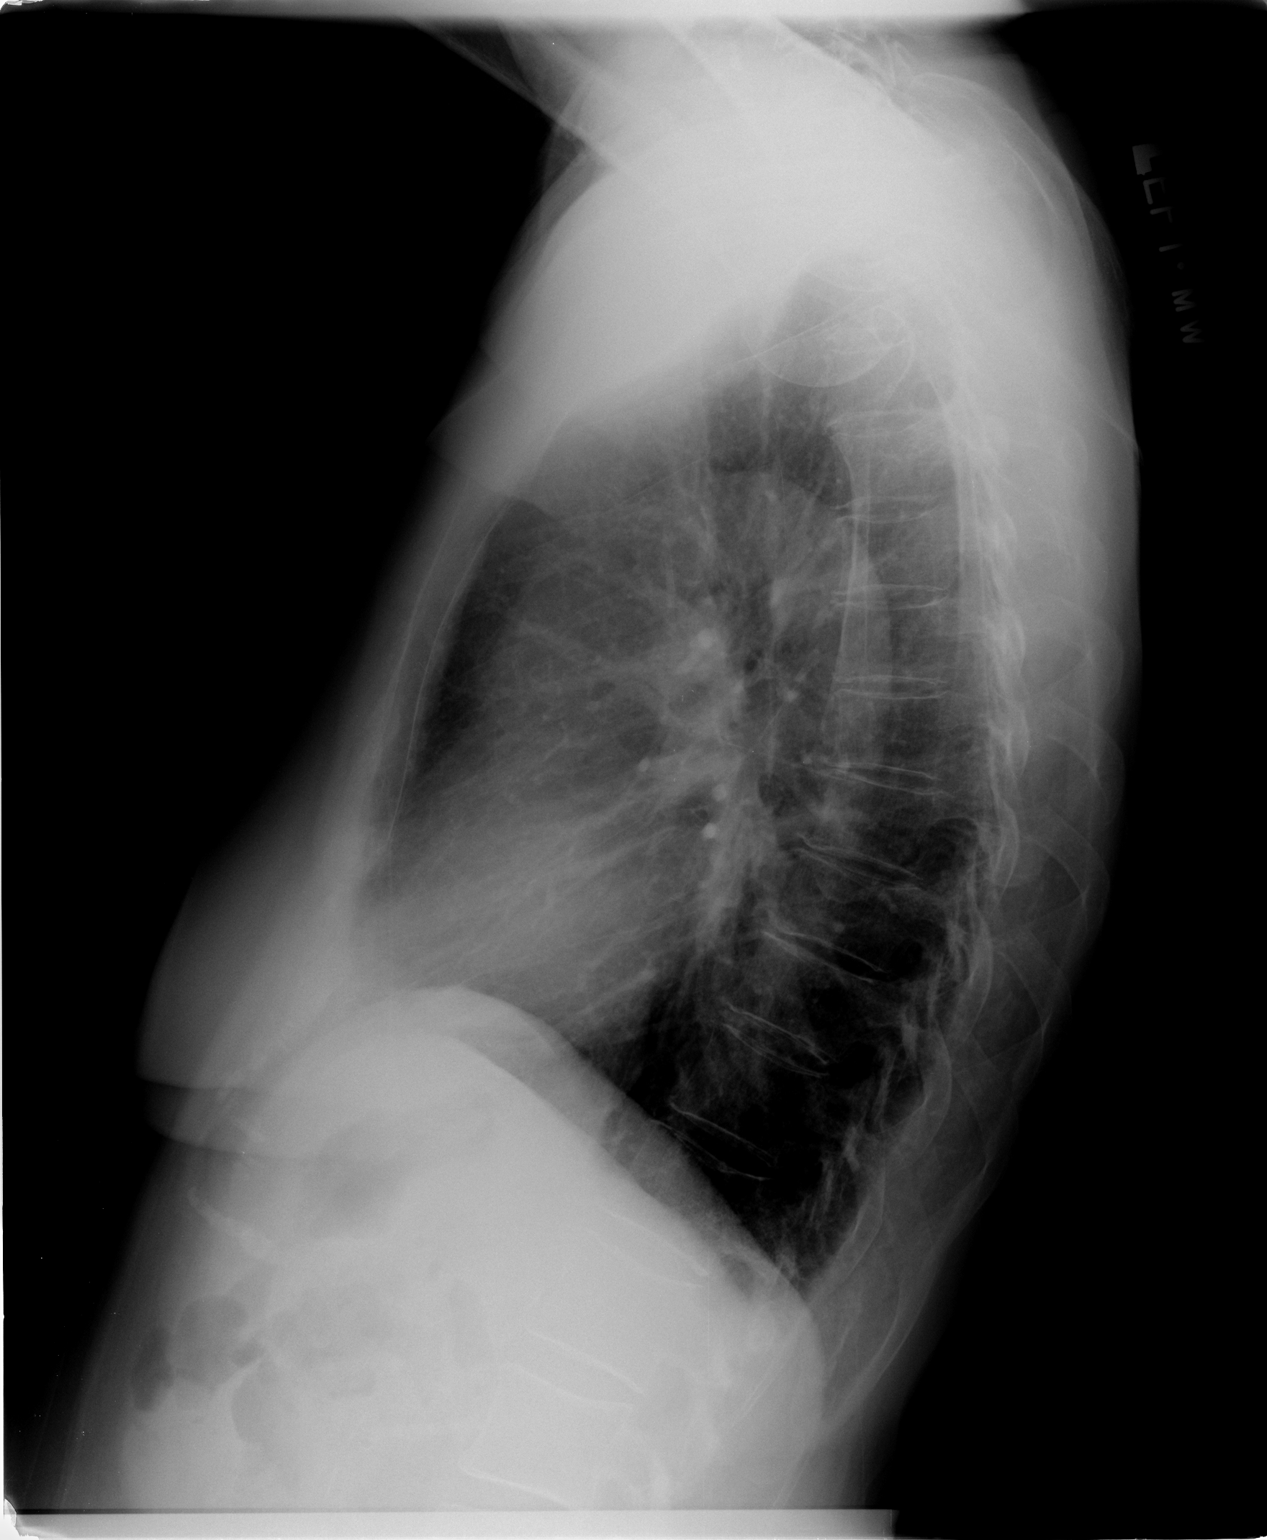

[2 of 2 positions shown; findings below may reference images not displayed]

FINDINGS: Stable mild cardiomegaly. Other mediastinal contours are
within normal limits.  Chronically large lung volumes.  The lungs
are clear. Visualized tracheal air column is within normal limits.
Osteopenia.
IMPRESSION: No acute cardiopulmonary abnormality.

## 2010-10-28 LAB — COMPREHENSIVE METABOLIC PANEL
ALT: 11 U/L (ref 0–35)
AST: 29 U/L (ref 0–37)
Albumin: 4 g/dL (ref 3.5–5.2)
Alkaline Phosphatase: 63 U/L (ref 39–117)
BUN: 14 mg/dL (ref 6–23)
CO2: 29 mEq/L (ref 19–32)
Calcium: 9.3 mg/dL (ref 8.4–10.5)
Chloride: 101 mEq/L (ref 96–112)
Creatinine, Ser: 0.93 mg/dL (ref 0.4–1.2)
GFR calc Af Amer: >60 mL/min (ref >60)
GFR calc non Af Amer: 60 mL/min — ABNORMAL LOW (ref >60)
Glucose, Bld: 100 mg/dL — ABNORMAL HIGH (ref 70–99)
Potassium: 3.6 mEq/L (ref 3.5–5.1)
Sodium: 139 mEq/L (ref 135–145)
Total Bilirubin: 0.9 mg/dL (ref 0.3–1.2)
Total Protein: 6.9 g/dL (ref 6.0–8.3)

## 2010-10-28 LAB — URINALYSIS, ROUTINE W REFLEX MICROSCOPIC
Bilirubin Urine: NEGATIVE
Glucose, UA: 100 mg/dL — AB
Hgb urine dipstick: NEGATIVE
Ketones, ur: NEGATIVE mg/dL
Nitrite: NEGATIVE
Protein, ur: NEGATIVE mg/dL
Specific Gravity, Urine: 1.018 (ref 1.005–1.030)
Urobilinogen, UA: 0.2 mg/dL (ref 0.0–1.0)
pH: 5.5 (ref 5.0–8.0)

## 2010-10-28 LAB — APTT: aPTT: 31 seconds (ref 24–37)

## 2010-10-28 LAB — DIFFERENTIAL
Basophils Absolute: 0 10*3/uL (ref 0.0–0.1)
Basophils Relative: 1 % (ref 0–1)
Eosinophils Absolute: 0.1 10*3/uL (ref 0.0–0.7)
Eosinophils Relative: 2 % (ref 0–5)
Lymphocytes Relative: 28 % (ref 12–46)
Lymphs Abs: 1.9 10*3/uL (ref 0.7–4.0)
Monocytes Absolute: 0.8 10*3/uL (ref 0.1–1.0)
Monocytes Relative: 12 % (ref 3–12)
Neutro Abs: 3.9 10*3/uL (ref 1.7–7.7)
Neutrophils Relative %: 57 % (ref 43–77)

## 2010-10-28 LAB — GLUCOSE, CAPILLARY
Glucose-Capillary: 100 mg/dL — ABNORMAL HIGH (ref 70–99)
Glucose-Capillary: 111 mg/dL — ABNORMAL HIGH (ref 70–99)
Glucose-Capillary: 113 mg/dL — ABNORMAL HIGH (ref 70–99)
Glucose-Capillary: 119 mg/dL — ABNORMAL HIGH (ref 70–99)
Glucose-Capillary: 122 mg/dL — ABNORMAL HIGH (ref 70–99)
Glucose-Capillary: 134 mg/dL — ABNORMAL HIGH (ref 70–99)
Glucose-Capillary: 142 mg/dL — ABNORMAL HIGH (ref 70–99)
Glucose-Capillary: 144 mg/dL — ABNORMAL HIGH (ref 70–99)

## 2010-10-28 LAB — CBC
HCT: 42.2 % (ref 36.0–46.0)
Hemoglobin: 14.4 g/dL (ref 12.0–15.0)
MCHC: 33.7 g/dL (ref 30.0–36.0)
MCHC: 34 g/dL (ref 30.0–36.0)
MCV: 88.9 fL (ref 78.0–100.0)
Platelets: 168 10*3/uL (ref 150–400)
RBC: 3.33 MIL/uL — ABNORMAL LOW (ref 3.87–5.11)
RBC: 4.75 MIL/uL (ref 3.87–5.11)
RDW: 13.4 % (ref 11.5–15.5)
RDW: 13.7 % (ref 11.5–15.5)
WBC: 6.9 10*3/uL (ref 4.0–10.5)

## 2010-10-28 LAB — TYPE AND SCREEN
ABO/RH(D): O NEG
Antibody Screen: NEGATIVE

## 2010-10-28 LAB — PROTIME-INR
INR: 1 (ref 0.00–1.49)
Prothrombin Time: 12.8 seconds (ref 11.6–15.2)

## 2010-11-03 ENCOUNTER — Inpatient Hospital Stay (HOSPITAL_COMMUNITY): Payer: Worker's Compensation

## 2010-11-03 ENCOUNTER — Inpatient Hospital Stay (HOSPITAL_COMMUNITY)
Admission: RE | Admit: 2010-11-03 | Discharge: 2010-11-05 | DRG: 460 | Disposition: A | Discharge status: Home / Self Care | Payer: Worker's Compensation | Source: Ambulatory Visit | Attending: Orthopedic Surgery | Admitting: Orthopedic Surgery

## 2010-11-03 DIAGNOSIS — I10 Essential (primary) hypertension: Secondary | ICD-10-CM | POA: Diagnosis present

## 2010-11-03 DIAGNOSIS — K219 Gastro-esophageal reflux disease without esophagitis: Secondary | ICD-10-CM | POA: Diagnosis present

## 2010-11-03 DIAGNOSIS — E119 Type 2 diabetes mellitus without complications: Secondary | ICD-10-CM | POA: Diagnosis present

## 2010-11-03 DIAGNOSIS — M5126 Other intervertebral disc displacement, lumbar region: Principal | ICD-10-CM | POA: Diagnosis present

## 2010-11-03 LAB — TYPE AND SCREEN

## 2010-11-03 IMAGING — RF DG LUMBAR SPINE 2-3V
1 series · 2 of 2 positions shown · non-contrast
Comparison: Lumbar myelogram CT [DATE].  Localization views
same date.

CLINICAL DATA: Revision of lumbar fusion.

LUMBAR SPINE - 2-3 VIEW

[Series 1: run · 2 of 2 slices shown]
[im 1/2]
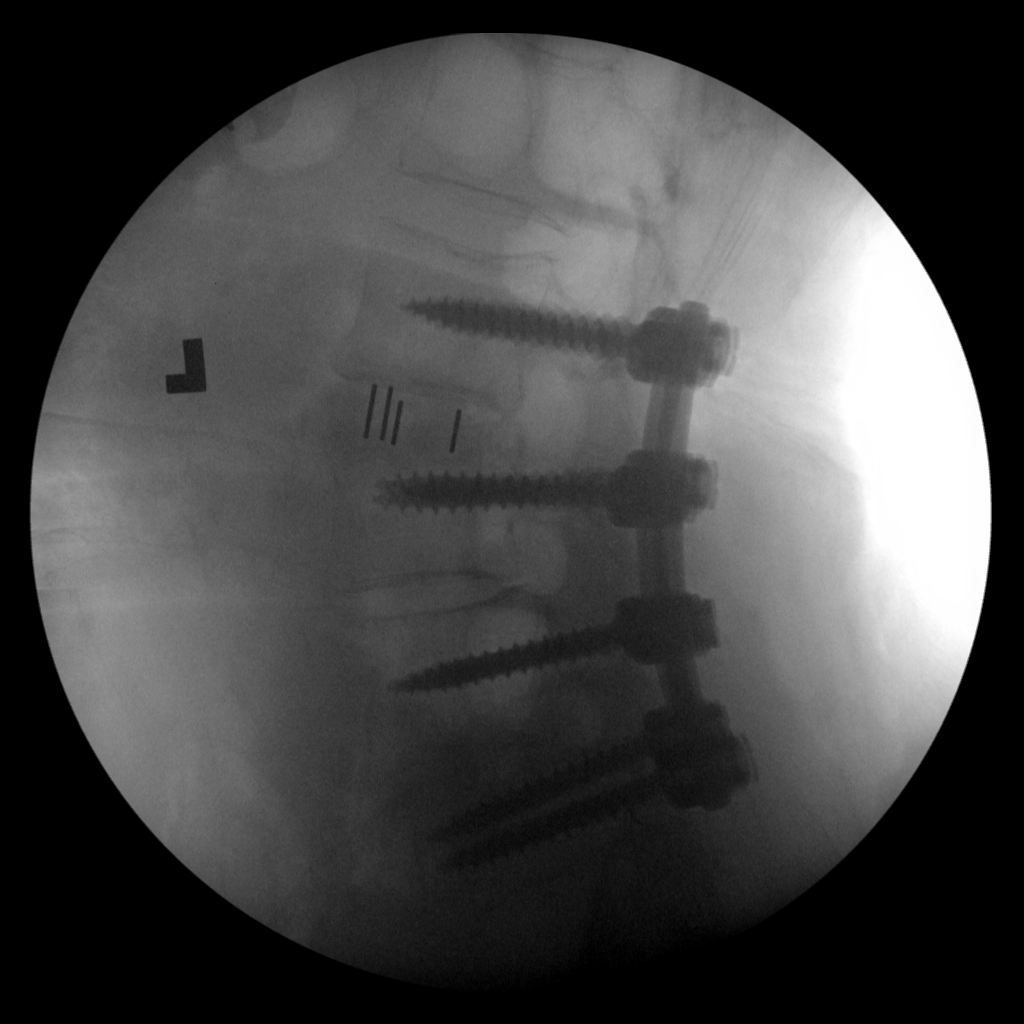
[im 2/2]
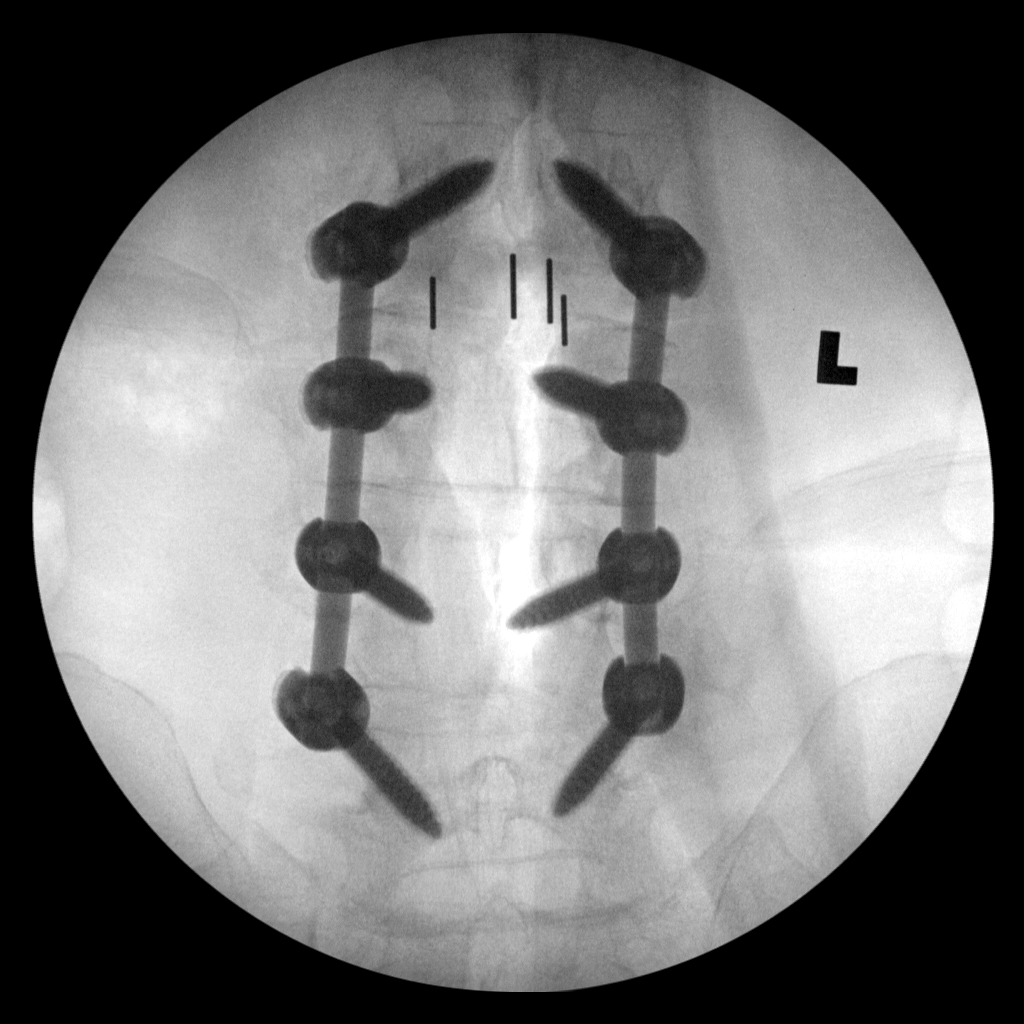

[2 of 2 positions shown; findings below may reference images not displayed]

FINDINGS: Spot fluoroscopic PA and lateral views of the lumbar
spine are submitted.  These demonstrate superior extension of the
lumbar fusion across the L2-L3 disc space.  L2-L3 interbody spacers
appear well positioned.  The new pedicle screws at L2 are well
positioned.  Inferior hardware appears unchanged.
IMPRESSION: No demonstrated complication following revision of lumbar fusion,
now extending from L2-L5.

## 2010-11-03 IMAGING — CR DG LUMBAR SPINE 1V
1 series · 1 of 1 positions shown · non-contrast
Comparison: Lumbar myelogram CT [DATE].

CLINICAL DATA: Revision L2-L3 TLIF and extension of fusion to L5.

LUMBAR SPINE - 1 VIEW

[view not recorded]
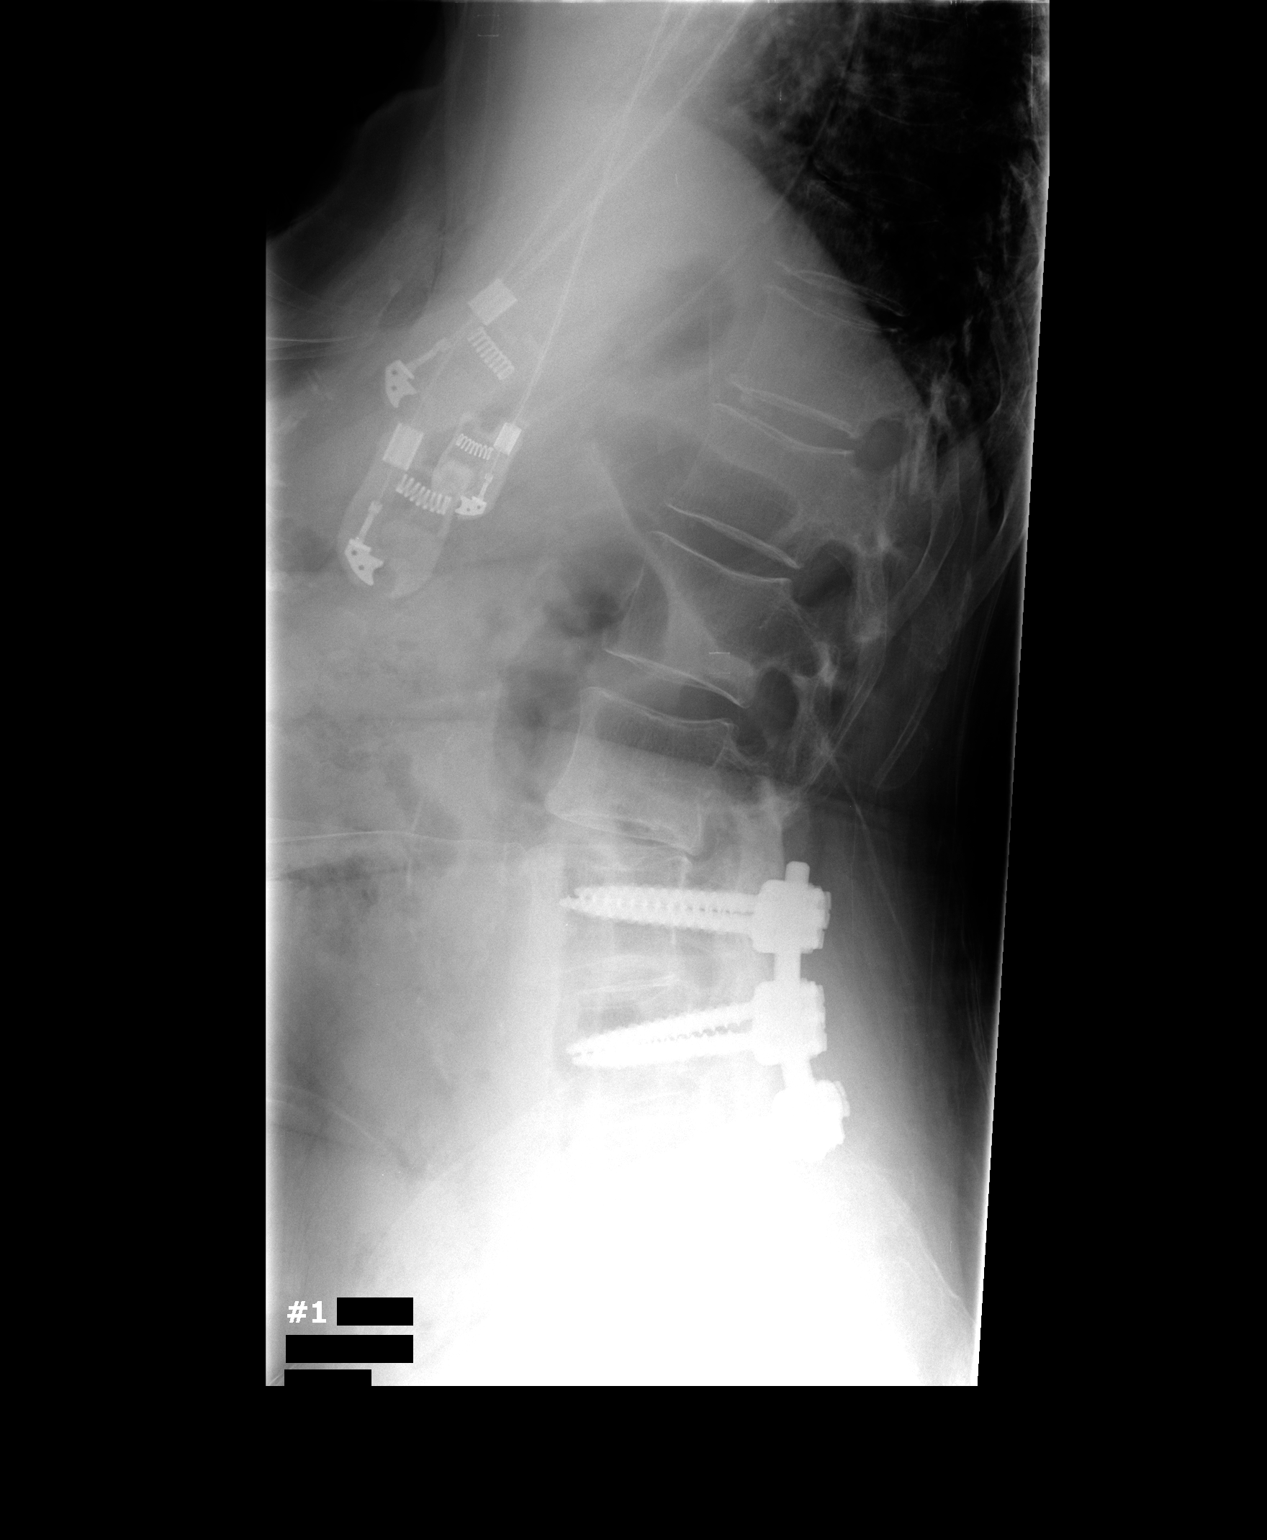

[1 of 1 positions shown; findings below may reference images not displayed]

FINDINGS: Cross-table lateral view labeled #1 at [0O] hours.  There
are stable postsurgical changes status post laminectomy and
posterior fusion from L3-L5.  The hardware appears intact.  No
localizing instruments are identified.
IMPRESSION: Stable appearance of the lumbar spine status post L3-L5 fusion.

## 2010-11-04 LAB — GLUCOSE, CAPILLARY
Glucose-Capillary: 134 mg/dL — ABNORMAL HIGH (ref 70–99)
Glucose-Capillary: 140 mg/dL — ABNORMAL HIGH (ref 70–99)
Glucose-Capillary: 154 mg/dL — ABNORMAL HIGH (ref 70–99)
Glucose-Capillary: 112 mg/dL — ABNORMAL HIGH (ref 70–99)

## 2010-11-04 NOTE — Op Note (Signed)
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  PROCEDURE: 1. Left transforaminal lumbar interbody fusion, L2-3. 2. Right-sided posterolateral fusion, L2-3. 3. Insertion of instrumentation, L2. 4. Reinsertion of instrumentation, L3 (screws were upsized from a 6-mm     screw to a 7-mm screw). 5. Insertion of intervertebral spacer (12-mm Concorde curved lordotic     cage). 6. Use of local autograft. 7. Intraoperative bone marrow aspiration. 8. Revision decompression, L2-3. 9. Intraoperative use of fluoroscopy. 10.Exploration of spinal fusion.  SURGEON:  Estill Bamberg, MD  ASSISTANT:  Laural Benes. Su Hilt, PA-C  ANESTHESIA:  General endotracheal anesthesia.  ESTIMATED BLOOD LOSS:  350 mL.  COMPLICATIONS:  None.  DISPOSITION:  Stable.  DRAINS:  None.  FINDINGS:  Extruded L2-3 disk fragment causing obvious compression of the spinal canal and compression of the traversing L3 nerve, significantly adherent to the surrounding dura, exiting L2 nerve, and traversing L3 nerve.  INDICATIONS FOR PROCEDURE:  Briefly, Karla Vincent is an extremely pleasant 70 year old  female who previously underwent an L2-L5 decompression and instrumented posterolateral fusion from L3-L5.  The patient did extremely well after her procedure, but went onto have adjacent segment disease associated the L2-3 level.  I did ultimately obtained a CT myelogram, which was consistent with an extruded disk fragment in the posterolateral gutter at the L2-3 level, which did appear to be emanating from the L2-3 disk.  There was an extruded fragment, which was located behind the L3 vertebral body at the level of the L3 pedicle.  I did feel that these findings were causing the patient's rather severe left-sided pain.  We did go forward with conservative treatment, but the patient went onto have an extensive debilitating pain.  We therefore had discussion regarding going forward with a diskectomy and instrumented posterolateral fusion and interbody fusion as noted above.  The patient fully understood the risks and limitations of the procedure as outlined in my preoperative note.  OPERATIVE DETAILS:  On November 03, 2010, the patient was brought to Surgery and general endotracheal anesthesia was administered.  The patient was given antibiotics prior to incision.  SCDs were placed.  A time-out procedure was performed.  The patient was then placed prone on a well-padded Jackson spinal frame.  All bony prominences were meticulously padded.  The back was then prepped and draped in the  usual sterile fashion.  I then obtained a lateral intraoperative radiograph in order to visualize the trajectory of the L2-3 interspace and the L2 pedicles.  I then went forward with the exposure.  The fascia was incised at the midline.  There was an extensive amount of scar tissue noted in the region of the epidural space.  I did use a Cobb, and I did dissect out laterally, and the L3-L5 instrumentation was readily identified.  The rods were removed bilaterally, as were the L3 screws bilaterally.  I then  subperiosteally exposed the transverse processes of L2 and L3 as well as the posterolateral fusion mass from L3 and below. Then on the right side, I went forward with instrumentation.  I cannulated the L2 screw using a 4-mm bur followed by a gearshift Lenke probe.  A ball-tip probe was used confirm that there was no cortical violation, and I tapped the pedicle with a 5-mm tap.  Bone wax was placed.  This was done on the right, and then on the left side.  Again, bone wax was placed.  I then turned my attention towards the L2-3 level. The pars interarticularis of L2 and L3 was readily identified as was the L2-3 facet joint.  I used a curette in order to develop a safe plane between the scar just adjacent to the pars interarticularis and the bone lateral to it.  I was able to safely make a plane, and I was able to ultimately remove the entire inferior articular process of L2, and the entire superior articular process of L3.  I just did take an extensive amount of care in detail in order to ensure that there was no durotomy or undue damage to the traversing or exiting nerves.  I was able, however, to explore the epidural space at the L2-3 level, and the traversing L3 nerve was swept medially.  Readily, I noted a rather significant disk protrusion, which was adherent to the dura, and the traversing and exiting nerves.  I used a series of curettes in order to tease away this disk herniation from the neurologic structures around it.  I was ultimately able to remove this disk fragment and removed compression of the exiting and traversing nerves.  Again, there was extensive scar tissue noted in this aspect of the procedure, the procedure was able to be done safely.  I then turned my attention towards the L2-3 interspace.  I went forward with a diskectomy using a series of paddle scrapers in addition to pituitary rongeurs and curettes.  A full and thorough diskectomy was performed.  I then  went forward placing a 10-mm curved trial.  I did feel this was an adequate fit.  I then placed a 10-mm graft, but I did feel that the graft was too loose and therefore the 10-mm graft was removed.  I then upsized the trials, and I felt that a 12-mm intervertebral graft to be the most appropriate fit.  I then packed the graft with autograft obtained from the decompressive aspect of the procedure.  I then obtained 8 mL of bone marrow aspirate from the patient's right iliac crest, and this was mixed with 10 mL aliquot of Vitoss BA.  The autograft and bone marrow aspirate and Vitoss mixture was packed into the intervertebral graft.  I then brought in live fluoroscopy, and I was able tab a 12-mm curved intervertebral spacer into adequate position using live fluoroscopy.  I then angled the cage appropriately, so that it was spanning  anterior aspect of the intervertebral space.  Of note, autograft and Vitoss was placed anterior to the intervertebral spacer prior to placement of the spacer.  I was very happy with the final press fit, and the appearance on radiographs.  At this point, I upsized the L3 screws using 7 x 45 mm screws, then I placed 7 x 45 mm screws at L2.  Motor evoked EMGs were used at L2 in order to confirm that there was no contact of the screws with any of the neurologic structures.  I then selected a 75-mm rod, and this was placed on the left side.  Caps were placed at L3, L4, and L5. I then performed a compression maneuver across the L2-3 interspace, and a cap was placed at L2, and final tightening procedure was performed. An 85-mm rod was placed on the right side.  Again, caps were placed at L3, L4, and L5.  Compression was placed across the L2-3 interspace, and a cap was placed at L2, and a final locking maneuver was performed here as well.  Of note, prior to placement of the screws, I did explore the fusion mass across L3, L4, and L5, and I did note that there was  an excellent robust fusion mass, and there was no motion across the L3-4 or L4-5 interspaces.  Of note, on the patient's right side, the side contralateral to the TLIF, I did use a 4-mm bur to decorticate the posterior elements, and the transverse processes of L2 and L3.  Prior to placement of the L2 and L3 screws on the right side, autograft and Vitoss BA was placed in order to achieve a posterolateral fusion.  I was very happy with the final construct.  I then obtained AP and lateral fluoroscopic views in order to confirm the appropriate levels and to confirm adequacy of the instrumentation.  I was very happy with the final construct.  At this point, the wound was copiously irrigated with 1 liter of normal saline.  The fascia was closed using #1 Vicryl.  The subcutaneous layer was closed using 2-0 Vicryl, and the skin was closed using 3-0 Monocryl.  All instrument counts were correct at the termination of the procedure.  Of note, Skip Mayer, PA-C, was my assistant throughout the procedure, and aided in essential retraction and suctioning throughout the procedure.     Estill Bamberg, MD     MD/MEDQ  D:  11/03/2010  T:  11/04/2010  Job:  [id number]  cc:   Dr. Anna Genre  Electronically Signed by Loraine Leriche Talulah Schirmer  on 11/04/2010 09:15:39 AM

## 2010-11-16 NOTE — Discharge Summary (Signed)
Karla Vincent, Karla Vincent                ACCOUNT NO.:  [id number]  MEDICAL RECORD NO.:  [id number]           PATIENT TYPE:  I  LOCATION:  5003                         FACILITY:  [facility]  PHYSICIAN:  Estill Bamberg, MD      DATE OF BIRTH:  Nov 27, 1940  DATE OF ADMISSION:  11/03/2010 DATE OF DISCHARGE:  11/05/2010                              DISCHARGE SUMMARY   ADMISSION DIAGNOSIS:  Status  post L2-5 decompression with an instrumented posterior spinal fusion from L3-L5 with adjacent segment disease noted at the L2-3 level and extruded disk fragment causing left- sided lumbar radiculopathy and spinal stenosis.  DISCHARGE DIAGNOSIS:  Status  post L2-5 decompression with an instrumented posterior spinal fusion from L3-L5 with adjacent segment disease noted at the L2-3 level and extruded disk fragment causing left- sided lumbar radiculopathy and spinal stenosis.  PROCEDURE:  Revision decompression, L2-3 with extension of her previous fusion to the L2-3 level using instrumentation, performed on November 03, 2010.  Please refer to my operative report for additional details of that procedure.  ADMITTING PHYSICIAN:  Estill Bamberg, MD  ADMISSION HISTORY:  Briefly, Karla Vincent is a very pleasant 70 year old female who is status post L2-L5 decompression and a posterolateral fusion by me and performed on April 14, 2009.  The patient did very well from the standpoint of that procedure and her pain was significantly relieved.  However, the patient did ultimately present back to my office over a year after that procedure with pain involving the left leg.  I did over a CT myelogram which was consistent with an extruded disk fragment in the posterolateral gutter, emanating from the L2-3 level.  This was causing rather severe radiculopathy and I therefore did refer the patient for an epidural injection.  The patient did get temporary improvement with the injection, but she did go on to have ongoing pain  in the left leg.  We therefore had a discussion regarding going forward with the procedure outlined above.  HOSPITAL COURSE:  On November 03, 2010, the patient was brought to the surgery and underwent the procedure as noted above.  The patient tolerated the procedure well and was transferred to the recovery in stable condition.  The patient was ultimately transferred to the floor the day of surgery.  A PCA was utilized overnight.  The PCA was discontinued on postop day #1, as was her Foley.  The patient was progressively mobilized with physical therapy team and with the occupational therapy team.  Of note, the patient's leg pain was relieved and she did have full neurovascular function throughout her hospital stay.  Her dressing was noted to be clean, dry and intact, and then it was on the morning of postop day #2 where the physical therapy team felt the patient will be safe for discharge home.  The patient is therefore uneventfully discharged on November 05, 2010.  DISCHARGE INSTRUCTIONS:  The patient will take Percocet for pain and Valium for spasms.  She will remove her dressing on postop day #5, at which point she can get the wound wet.  She will follow up with me at approximately  2 weeks after her procedure.  She understands to contact me with any questions or concerns prior to then.     Estill Bamberg, MD     MD/MEDQ  D:  11/09/2010  T:  11/10/2010  Job:  [id number]  Electronically Signed by Estill Bamberg  on 11/16/2010 08:14:19 AM

## 2010-12-09 NOTE — H&P (Signed)
NAMEKAYDINCE, TOWLES NO.:  [id number]   MEDICAL RECORD NO.:  [id number]          PATIENT TYPE:  EMS   LOCATION:  MAJO                         FACILITY:  [facility]   PHYSICIAN:  Burnard Bunting, M.D.    DATE OF BIRTH:  September 17, 1940   DATE OF ADMISSION:  06/13/2005  DATE OF DISCHARGE:                                HISTORY & PHYSICAL   CHIEF COMPLAINT:  Left wrist pain.   HISTORY OF PRESENT ILLNESS:  Karla Vincent is a 70 year old right-handed  dominant female who fell today on her left wrist. She denies any other  orthopedic complaints or loss of consciousness. She does report significant  left wrist pain.   PAST MEDICAL HISTORY:  1.  Diabetes.  2.  Hypertension.   PAST SURGICAL HISTORY:  1.  Left digit two open reduction/internal fixation complicated by RSD      requiring nerve blocks.   CURRENT MEDICATIONS:  1.  Benicar 12.5 milligrams daily.  2.  Lexapro 10 milligrams daily.  3.  Actos 30 milligrams p.o. daily.  4.  Zantac 30 milligrams p.o. q.h.s.  5.  Hydrochlorothiazide 25 milligrams p.o. daily.  6.  Aspirin 81 milligrams p.o. daily.   ALLERGIES:  She is allergic to CODEINE and SULFATE.   REVIEW OF SYSTEMS:  She denies any fevers, chills, weight loss, shortness of  breath. Otherwise noncontributory.   FAMILY HISTORY:  The patient does have family here in [place] and she  lives here in [place].   PHYSICAL EXAMINATION:  Room air saturation is 93%, blood pressure is 112/67,  heart rate 86, respiratory rate 20. Chest is clear to auscultation. Heart  has regular rate and rhythm. Abdominal exam is benign. Left wrist  demonstrates deformity. Radial pulse 2+/4. She has decreased range of motion  in the fingers. Sensation is generally intact.   Radiographs demonstrated a comminuted intra-articular distal radius  fracture.   LABORATORY DATA:  EKG and chest x-ray are pending.   IMPRESSION:  Left distal radius fracture.   PLAN:  With IV pain  medication and a Marcaine block, the wrist is reduced.  Near anatomic alignment is achieved but the wrist remains unstable. The plan  at this time is for open reduction/internal fixation tomorrow after  elevation tonight. Risks and benefits were discussed with the patient. They  include but are not limited to infection, nerve vessel damage, nonunion,  malunion. All questions answered.           ______________________________  G. Dorene Grebe, M.D.     GSD/MEDQ  D:  06/13/2005  T:  06/14/2005  Job:  [id number]

## 2010-12-09 NOTE — Op Note (Signed)
[name], [name] NO.:  [id number]   MEDICAL RECORD NO.:  [id number]          PATIENT TYPE:  INP   LOCATION:  5729                         FACILITY:  [facility]   PHYSICIAN:  Burnard Bunting, M.D.    DATE OF BIRTH:  Nov 10, 1940   DATE OF PROCEDURE:  06/14/2005  DATE OF DISCHARGE:                                 OPERATIVE REPORT   PREOPERATIVE DIAGNOSES:  Left wrist fracture.   POSTOPERATIVE DIAGNOSIS:  Left wrist fracture.   PROCEDURE:  Left wrist open reduction internal fixation.   SURGEON:  Burnard Bunting, M.D.   ASSISTANT:  None.   ANESTHESIA:  General endotracheal.   ESTIMATED BLOOD LOSS:  20 cc.   DRAINS:  None.   TOURNIQUET TIME:  15 min at 275 mmHg.   PROCEDURE IN DETAIL:  The patient was brought to the operating room, where  general endotracheal anesthesia was induced.  Preoperative antibiotics were  administered.  The left wrist was prepped with DuraPrep solution and draped  in a sterile manner.  Collier Flowers was used to cover the operative field.  The arm  was elevated and exsanguinated with an Esmarch wrap.  The tourniquet was  then inflated.  An incision was made from the proximal wrist flexion crease  along the SCR tendon proximally about 8 cm.  Skin and subcutaneous tissues  were sharply divided.  The SCR tendon was identified.  The sheath on the  ventral and dorsal surface was incised.  The sheath and the radial artery  were mobilized radially.  The pronator quadratus was identified and was then  incised on the radial border of the radius.  Traction site was identified.  An Accumed plate was then applied, with one screw through the adjustable  hole in the plate.  The plate was then adjusted so that it would fit well  from the reduced fracture.  With manual reduction placed, the distal screws  were placed into and across the distal fragment; with good restoration of  the radial height inclination and tilt achieved.  Two proximal locking  screws were  then placed in the proximal-most aspect of the plate.  The wrist  was taken through a range of motion and found to be stable.  The incision  was thoroughly irrigated.  At this point 1 cc of bone graft was then applied  into a dorsal corporal defect, proximal to the articular surface.  The  tourniquet was released.  Bleeding points were controlled with bipolar  electrocautery.  The  hand perfused nicely.  Skin was closed using interrupted and inverted 2-0  Vicryl sutures, followed by interrupted and layered 3-0 Prolene.  A volar  splint was applied.  The patient tolerated the procedure well without any  complications.           ______________________________  G. Dorene Grebe, M.D.     GSD/MEDQ  D:  06/14/2005  T:  06/15/2005  Job:  [id number]

## 2010-12-09 NOTE — Op Note (Signed)
NAMEDEIDREA, Karla Vincent NO.:  [id number]   MEDICAL RECORD NO.:  [id number]          PATIENT TYPE:  AMB   LOCATION:  DSC                          FACILITY:  [facility]   PHYSICIAN:  Lowell Bouton, M.D.DATE OF BIRTH:  1941/05/19   DATE OF PROCEDURE:  06/01/2004  DATE OF DISCHARGE:                                 OPERATIVE REPORT   PREOPERATIVE DIAGNOSIS:  Left carpal tunnel syndrome.   POSTOPERATIVE DIAGNOSIS:  Left carpal tunnel syndrome.   PROCEDURE:  Decompression, median nerve, left carpal tunnel.   SURGEON:  Lowell Bouton, M.D.   ANESTHESIA:  Axillary block.   OPERATIVE FINDINGS:  The patient had significant pressure on her median  nerve.  The motor branch was intact, and there were no masses in her carpal  canal.   PROCEDURE:  Under axillary block anesthesia with a tourniquet on the left  arm, th left hand was prepped and draped in the usual fashion and after  exsanguinating the limb, the tourniquet was inflated to 250 mmHg.  A 3 cm  longitudinal incision was made in the palm just ulnar to the thenar crease.  Sharp dissection was carried through the subcutaneous tissues.  Blunt  dissection was carried through the superficial palmar fascia distal to the  transverse carpal ligament.  A hemostat was then placed in the carpal canal  at the hook of the hamate, and the transverse carpal ligament was divided on  the ulnar border of the median nerve.  The proximal end of the ligament was  divided with a scissors after dissecting the nerve away from the  undersurface of the ligament.  The carpal canal was palpated.  It was found  to be adequately decompressed.  The nerve was examined and the motor branch  was identified.  The wound was then irrigated with saline and the skin was  closed with 4-0 nylon suture.  Sterile dressings were applied, followed by a  volar wrist splint.  The patient tolerated the procedure well and went to  the recovery room  awake and in stable condition.       EMM/MEDQ  D:  06/01/2004  T:  06/01/2004  Job:  [id number]

## 2014-10-21 DIAGNOSIS — I10 Essential (primary) hypertension: Secondary | ICD-10-CM | POA: Diagnosis not present

## 2014-10-21 DIAGNOSIS — E780 Pure hypercholesterolemia: Secondary | ICD-10-CM | POA: Diagnosis not present

## 2014-10-21 DIAGNOSIS — E119 Type 2 diabetes mellitus without complications: Secondary | ICD-10-CM | POA: Diagnosis not present

## 2014-10-21 DIAGNOSIS — F419 Anxiety disorder, unspecified: Secondary | ICD-10-CM | POA: Diagnosis not present

## 2014-10-21 DIAGNOSIS — Z79899 Other long term (current) drug therapy: Secondary | ICD-10-CM | POA: Diagnosis not present

## 2015-02-17 DIAGNOSIS — H2513 Age-related nuclear cataract, bilateral: Secondary | ICD-10-CM | POA: Diagnosis not present

## 2015-02-17 DIAGNOSIS — H04123 Dry eye syndrome of bilateral lacrimal glands: Secondary | ICD-10-CM | POA: Diagnosis not present

## 2015-02-17 DIAGNOSIS — E119 Type 2 diabetes mellitus without complications: Secondary | ICD-10-CM | POA: Diagnosis not present

## 2015-02-23 DIAGNOSIS — R300 Dysuria: Secondary | ICD-10-CM | POA: Diagnosis not present

## 2015-02-23 DIAGNOSIS — N390 Urinary tract infection, site not specified: Secondary | ICD-10-CM | POA: Diagnosis not present

## 2015-04-13 DIAGNOSIS — H2513 Age-related nuclear cataract, bilateral: Secondary | ICD-10-CM | POA: Diagnosis not present

## 2015-04-28 DIAGNOSIS — E119 Type 2 diabetes mellitus without complications: Secondary | ICD-10-CM | POA: Diagnosis not present

## 2015-04-28 DIAGNOSIS — Z1389 Encounter for screening for other disorder: Secondary | ICD-10-CM | POA: Diagnosis not present

## 2015-04-28 DIAGNOSIS — G629 Polyneuropathy, unspecified: Secondary | ICD-10-CM | POA: Diagnosis not present

## 2015-04-28 DIAGNOSIS — Z139 Encounter for screening, unspecified: Secondary | ICD-10-CM | POA: Diagnosis not present

## 2015-04-28 DIAGNOSIS — Z79899 Other long term (current) drug therapy: Secondary | ICD-10-CM | POA: Diagnosis not present

## 2015-04-28 DIAGNOSIS — F419 Anxiety disorder, unspecified: Secondary | ICD-10-CM | POA: Diagnosis not present

## 2015-04-28 DIAGNOSIS — I10 Essential (primary) hypertension: Secondary | ICD-10-CM | POA: Diagnosis not present

## 2015-05-03 DIAGNOSIS — I10 Essential (primary) hypertension: Secondary | ICD-10-CM | POA: Diagnosis not present

## 2015-05-03 DIAGNOSIS — K449 Diaphragmatic hernia without obstruction or gangrene: Secondary | ICD-10-CM | POA: Diagnosis not present

## 2015-05-03 DIAGNOSIS — E1136 Type 2 diabetes mellitus with diabetic cataract: Secondary | ICD-10-CM | POA: Diagnosis not present

## 2015-05-03 DIAGNOSIS — H2511 Age-related nuclear cataract, right eye: Secondary | ICD-10-CM | POA: Diagnosis not present

## 2015-05-03 DIAGNOSIS — Z79899 Other long term (current) drug therapy: Secondary | ICD-10-CM | POA: Diagnosis not present

## 2015-05-03 DIAGNOSIS — K219 Gastro-esophageal reflux disease without esophagitis: Secondary | ICD-10-CM | POA: Diagnosis not present

## 2015-05-03 DIAGNOSIS — H269 Unspecified cataract: Secondary | ICD-10-CM | POA: Diagnosis not present

## 2015-05-03 DIAGNOSIS — Z7982 Long term (current) use of aspirin: Secondary | ICD-10-CM | POA: Diagnosis not present

## 2015-05-17 DIAGNOSIS — I10 Essential (primary) hypertension: Secondary | ICD-10-CM | POA: Diagnosis not present

## 2015-05-17 DIAGNOSIS — M1380 Other specified arthritis, unspecified site: Secondary | ICD-10-CM | POA: Diagnosis not present

## 2015-05-17 DIAGNOSIS — Z79899 Other long term (current) drug therapy: Secondary | ICD-10-CM | POA: Diagnosis not present

## 2015-05-17 DIAGNOSIS — Z7982 Long term (current) use of aspirin: Secondary | ICD-10-CM | POA: Diagnosis not present

## 2015-05-17 DIAGNOSIS — Z961 Presence of intraocular lens: Secondary | ICD-10-CM | POA: Diagnosis not present

## 2015-05-17 DIAGNOSIS — H2512 Age-related nuclear cataract, left eye: Secondary | ICD-10-CM | POA: Diagnosis not present

## 2015-05-17 DIAGNOSIS — Z9841 Cataract extraction status, right eye: Secondary | ICD-10-CM | POA: Diagnosis not present

## 2015-05-17 DIAGNOSIS — E1136 Type 2 diabetes mellitus with diabetic cataract: Secondary | ICD-10-CM | POA: Diagnosis not present

## 2015-05-17 DIAGNOSIS — H269 Unspecified cataract: Secondary | ICD-10-CM | POA: Diagnosis not present

## 2015-06-23 DIAGNOSIS — Z961 Presence of intraocular lens: Secondary | ICD-10-CM | POA: Diagnosis not present

## 2015-07-14 DIAGNOSIS — R52 Pain, unspecified: Secondary | ICD-10-CM | POA: Diagnosis not present

## 2015-07-14 DIAGNOSIS — R509 Fever, unspecified: Secondary | ICD-10-CM | POA: Diagnosis not present

## 2015-07-14 DIAGNOSIS — J3489 Other specified disorders of nose and nasal sinuses: Secondary | ICD-10-CM | POA: Diagnosis not present

## 2015-08-11 DIAGNOSIS — J029 Acute pharyngitis, unspecified: Secondary | ICD-10-CM | POA: Diagnosis not present

## 2015-08-11 DIAGNOSIS — R5383 Other fatigue: Secondary | ICD-10-CM | POA: Diagnosis not present

## 2015-08-11 DIAGNOSIS — J3489 Other specified disorders of nose and nasal sinuses: Secondary | ICD-10-CM | POA: Diagnosis not present

## 2015-09-22 DIAGNOSIS — I10 Essential (primary) hypertension: Secondary | ICD-10-CM | POA: Diagnosis not present

## 2015-09-22 DIAGNOSIS — E7800 Pure hypercholesterolemia, unspecified: Secondary | ICD-10-CM | POA: Diagnosis not present

## 2015-09-22 DIAGNOSIS — E119 Type 2 diabetes mellitus without complications: Secondary | ICD-10-CM | POA: Diagnosis not present

## 2015-09-22 DIAGNOSIS — Z833 Family history of diabetes mellitus: Secondary | ICD-10-CM | POA: Diagnosis not present

## 2015-09-22 DIAGNOSIS — Z139 Encounter for screening, unspecified: Secondary | ICD-10-CM | POA: Diagnosis not present

## 2015-09-29 DIAGNOSIS — L299 Pruritus, unspecified: Secondary | ICD-10-CM | POA: Diagnosis not present

## 2015-09-29 DIAGNOSIS — E119 Type 2 diabetes mellitus without complications: Secondary | ICD-10-CM | POA: Diagnosis not present

## 2015-09-29 DIAGNOSIS — I10 Essential (primary) hypertension: Secondary | ICD-10-CM | POA: Diagnosis not present

## 2015-10-15 DIAGNOSIS — R300 Dysuria: Secondary | ICD-10-CM | POA: Diagnosis not present

## 2015-10-15 DIAGNOSIS — M549 Dorsalgia, unspecified: Secondary | ICD-10-CM | POA: Diagnosis not present

## 2015-10-15 DIAGNOSIS — N390 Urinary tract infection, site not specified: Secondary | ICD-10-CM | POA: Diagnosis not present

## 2015-11-22 DIAGNOSIS — G4701 Insomnia due to medical condition: Secondary | ICD-10-CM | POA: Diagnosis not present

## 2015-11-22 DIAGNOSIS — E119 Type 2 diabetes mellitus without complications: Secondary | ICD-10-CM | POA: Diagnosis not present

## 2015-11-22 DIAGNOSIS — I10 Essential (primary) hypertension: Secondary | ICD-10-CM | POA: Diagnosis not present

## 2021-09-28 ENCOUNTER — Emergency Department (HOSPITAL_COMMUNITY)
Admission: EM | Admit: 2021-09-28 | Discharge: 2021-09-29 | Disposition: A | Discharge status: Home / Self Care | Payer: Medicare HMO | Attending: Emergency Medicine | Admitting: Emergency Medicine

## 2021-09-28 ENCOUNTER — Emergency Department (HOSPITAL_COMMUNITY): Payer: Medicare HMO

## 2021-09-28 ENCOUNTER — Encounter (HOSPITAL_COMMUNITY): Payer: Self-pay

## 2021-09-28 DIAGNOSIS — R42 Dizziness and giddiness: Secondary | ICD-10-CM | POA: Insufficient documentation

## 2021-09-28 DIAGNOSIS — R001 Bradycardia, unspecified: Secondary | ICD-10-CM | POA: Diagnosis not present

## 2021-09-28 DIAGNOSIS — R531 Weakness: Secondary | ICD-10-CM | POA: Insufficient documentation

## 2021-09-28 DIAGNOSIS — Z79899 Other long term (current) drug therapy: Secondary | ICD-10-CM | POA: Diagnosis not present

## 2021-09-28 DIAGNOSIS — I10 Essential (primary) hypertension: Secondary | ICD-10-CM | POA: Diagnosis present

## 2021-09-28 HISTORY — DX: Essential (primary) hypertension: I10

## 2021-09-28 HISTORY — DX: Type 2 diabetes mellitus without complications: E11.9

## 2021-09-28 HISTORY — DX: Gastro-esophageal reflux disease without esophagitis: K21.9

## 2021-09-28 LAB — BASIC METABOLIC PANEL
Anion gap: 8 (ref 5–15)
BUN: 14 mg/dL (ref 8–23)
CO2: 27 mmol/L (ref 22–32)
Calcium: 9 mg/dL (ref 8.9–10.3)
Chloride: 103 mmol/L (ref 98–111)
Creatinine, Ser: 0.84 mg/dL (ref 0.44–1.00)
GFR, Estimated: >60 mL/min (ref >60)
Glucose, Bld: 130 mg/dL — ABNORMAL HIGH (ref 70–99)
Potassium: 3.4 mmol/L — ABNORMAL LOW (ref 3.5–5.1)
Sodium: 138 mmol/L (ref 135–145)

## 2021-09-28 LAB — CBC
HCT: 42.4 % (ref 36.0–46.0)
Hemoglobin: 13.7 g/dL (ref 12.0–15.0)
MCH: 28.1 pg (ref 26.0–34.0)
MCHC: 32.3 g/dL (ref 30.0–36.0)
MCV: 86.9 fL (ref 80.0–100.0)
Platelets: 177 10*3/uL (ref 150–400)
RBC: 4.88 MIL/uL (ref 3.87–5.11)
RDW: 12.8 % (ref 11.5–15.5)
WBC: 6.4 10*3/uL (ref 4.0–10.5)
nRBC: 0 % (ref 0.0–0.2)

## 2021-09-28 IMAGING — CR DG CHEST 2V
2 series · 2 of 2 positions shown · non-contrast
Comparison: [DATE]

CLINICAL DATA: History of hypertension, presenting with weakness.

EXAM:
CHEST - 2 VIEW

[chest pa]
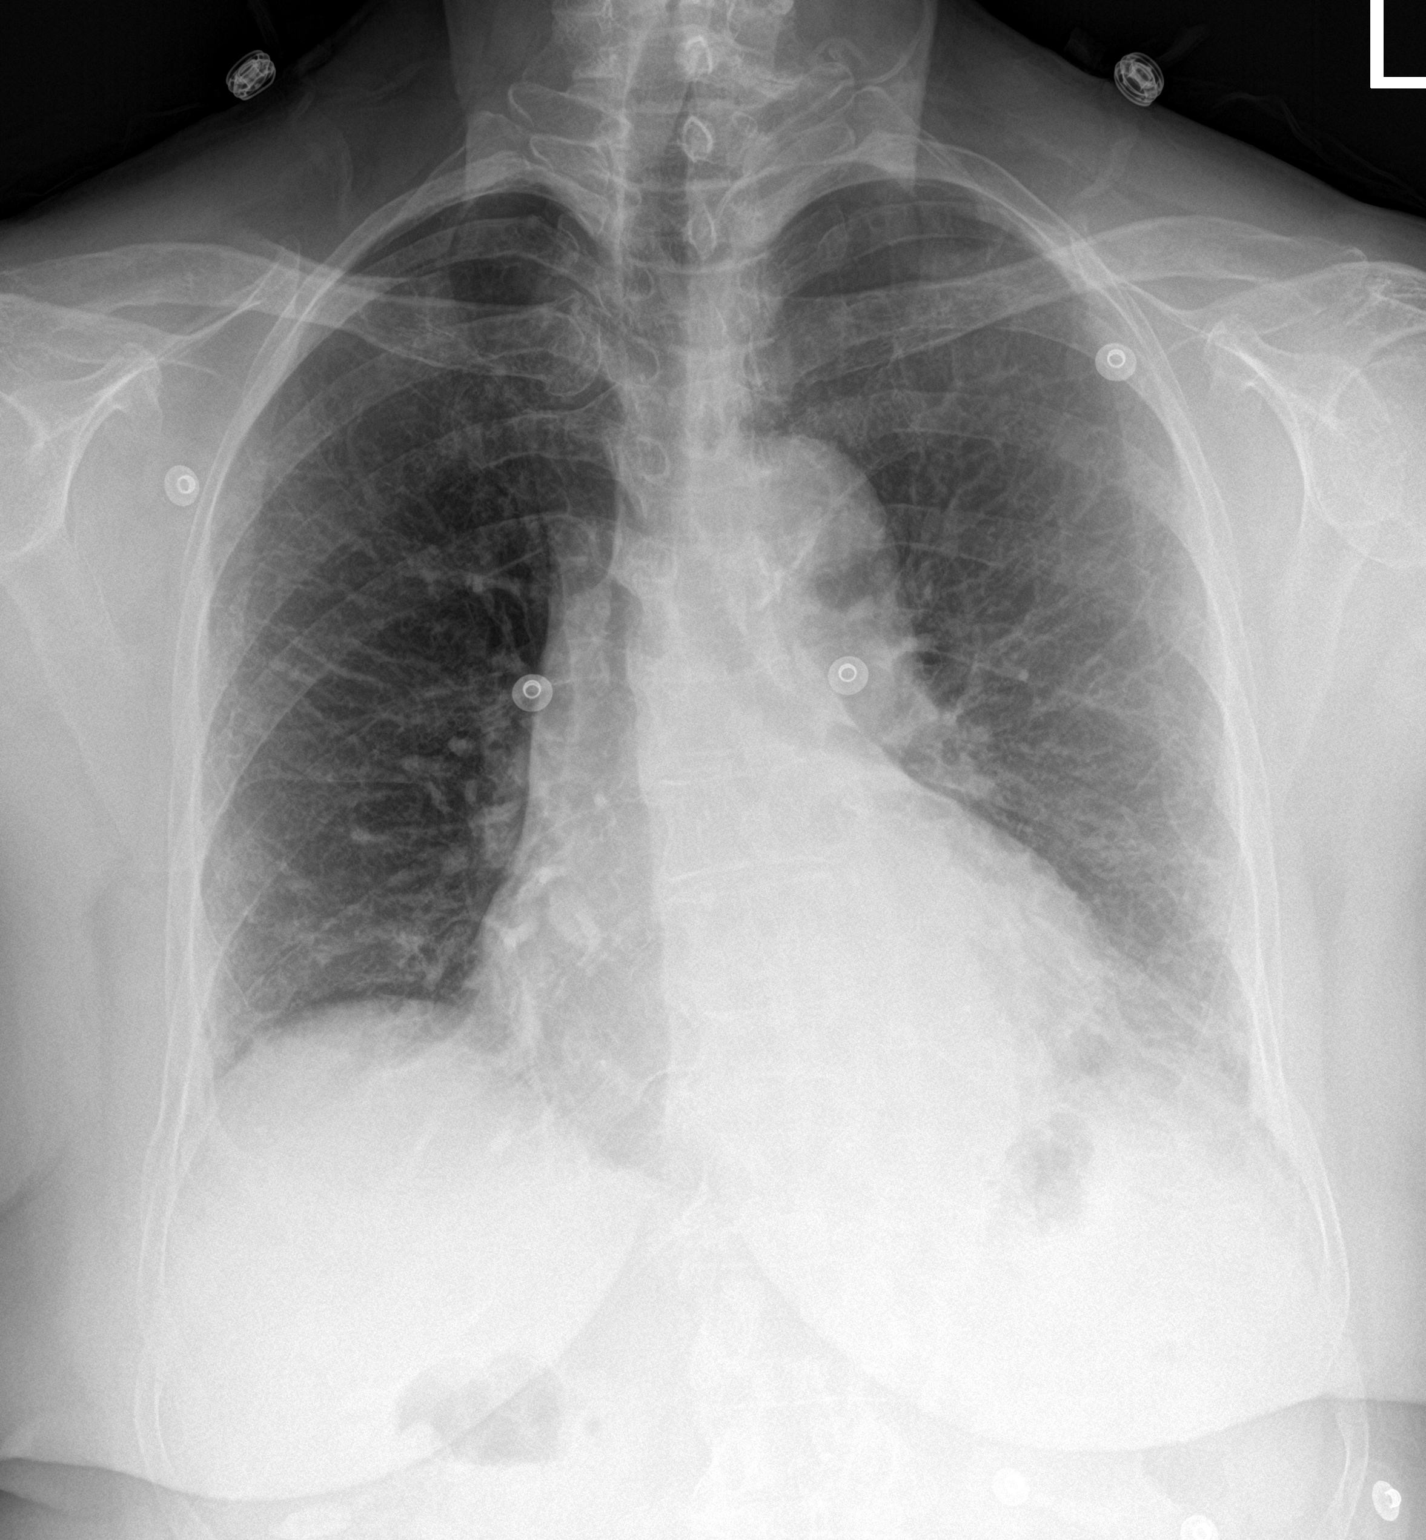

[chest lat]
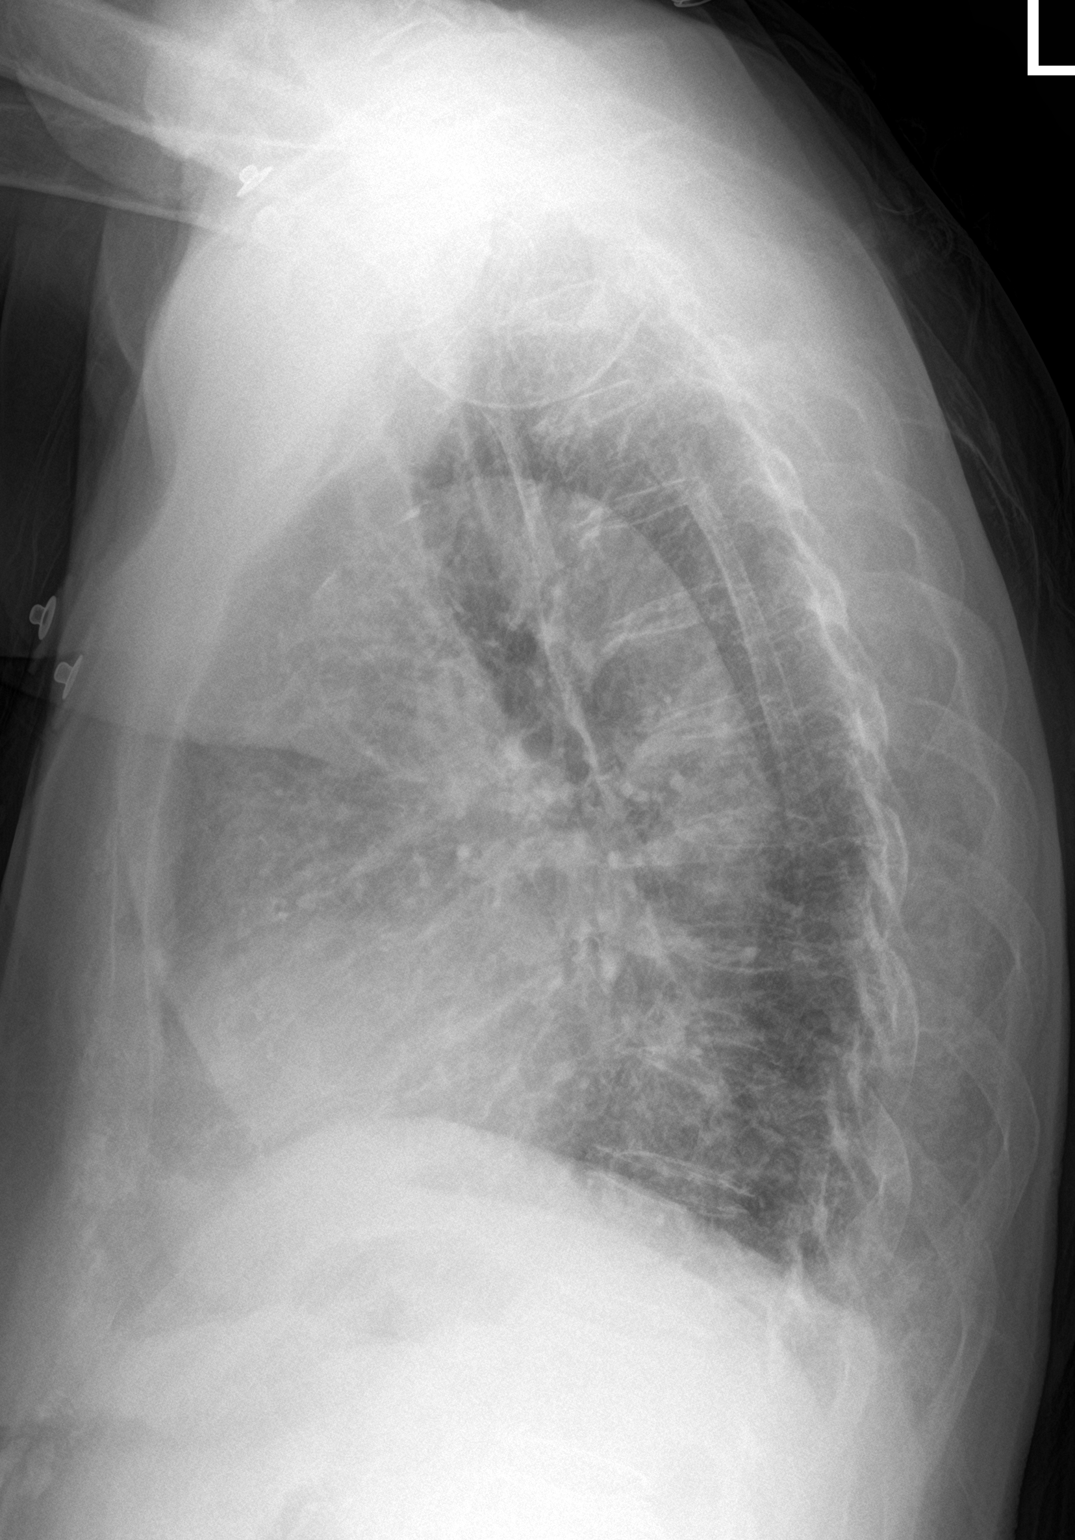

[2 of 2 positions shown; findings below may reference images not displayed]

FINDINGS: Mild, diffuse, chronic appearing increased interstitial lung
markings are seen. Mild atelectasis is noted within the left lung
base. There is no evidence of a pleural effusion or pneumothorax.
The heart size and mediastinal contours are within normal limits.
There is mild calcification of the aortic arch. The visualized
skeletal structures are unremarkable.
IMPRESSION: Chronic appearing increased interstitial lung markings with mild
left basilar atelectasis.

## 2021-09-28 MED ORDER — LOSARTAN POTASSIUM 50 MG PO TABS
100.0000 mg | ORAL_TABLET | Freq: Once | ORAL | Status: AC
  Administered 2021-09-29: 01:00:00 100 mg via ORAL
  Filled 2021-09-28: qty 2

## 2021-09-28 MED ORDER — HYDROCHLOROTHIAZIDE 25 MG PO TABS
25.0000 mg | ORAL_TABLET | Freq: Every day | ORAL | Status: DC
  Administered 2021-09-29: 01:00:00 25 mg via ORAL
  Filled 2021-09-28: qty 1

## 2021-09-28 MED ORDER — LOSARTAN POTASSIUM 50 MG PO TABS: 100.0000 mg | ORAL_TABLET | ORAL | Status: DC

## 2021-09-28 MED ORDER — HYDROCHLOROTHIAZIDE 25 MG PO TABS: 25.0000 mg | ORAL_TABLET | Freq: Every day | ORAL | Status: DC

## 2021-09-28 NOTE — ED Triage Notes (Signed)
Pt comes via [facility], pt has been feeling lightheaded and dizzy since Monday since she ran out of her metoprolol  ?

## 2021-09-28 NOTE — ED Provider Notes (Signed)
?[facility] EMERGENCY DEPARTMENT ?Provider Note ? ? ?CSN: [id number] ?Arrival date & time: 09/28/21  2204 ? ?  ? ?History ? ?Chief Complaint  ?Patient presents with  ? Hypertension  ? ? ?Karla Vincent is a 81 y.o. female. ? ?81 yo F w/ h/o HTN. On losartan/hctz at home. Ran out. Took her old metoprolol (which they stopped a couple years ago because it lowered her heart rate and caused dizziness/weakness). Today she felt tired and didn't want to get out of bed. No other symptoms. Called a family member who is a cardiologist and they suggested taking her daughter's medication (I think it is triamterene/hctz based on descriptitons) but BP did not improve so came here for eval.  ? ? ?Hypertension ?This is a new problem. The current episode started 3 to 5 hours ago. The problem occurs constantly. The problem has not changed since onset.Pertinent negatives include no chest pain, no headaches and no shortness of breath. Nothing aggravates the symptoms. Nothing relieves the symptoms.  ? ?  ? ?Home Medications ?Prior to Admission medications   ?Medication Sig Start Date End Date Taking? Authorizing Provider  ?losartan-hydrochlorothiazide (HYZAAR) 100-25 MG tablet Take 1 tablet by mouth daily. 09/29/21  Yes Maelle Sheaffer, Barbara Cower, MD  ?   ? ?Allergies    ?Sulfa antibiotics   ? ?Review of Systems   ?Review of Systems  ?Respiratory:  Negative for shortness of breath.   ?Cardiovascular:  Negative for chest pain.  ?Neurological:  Negative for headaches.  ? ?Physical Exam ?Updated Vital Signs ?BP (!) 193/83   Pulse (!) 57   Temp 98.6 ?F (37 ?C) (Oral)   Resp 16   SpO2 99%  ?Physical Exam ?Vitals and nursing note reviewed.  ?Constitutional:   ?   Appearance: She is well-developed.  ?HENT:  ?   Head: Normocephalic and atraumatic.  ?   Mouth/Throat:  ?   Mouth: Mucous membranes are moist.  ?   Pharynx: Oropharynx is clear.  ?Eyes:  ?   Pupils: Pupils are equal, round, and reactive to light.  ?Cardiovascular:  ?   Rate and  Rhythm: Regular rhythm. Bradycardia present.  ?Pulmonary:  ?   Effort: No respiratory distress.  ?   Breath sounds: No stridor.  ?Abdominal:  ?   General: Abdomen is flat. There is no distension.  ?Musculoskeletal:     ?   General: No swelling or tenderness. Normal range of motion.  ?   Cervical back: Normal range of motion.  ?Skin: ?   General: Skin is warm and dry.  ?Neurological:  ?   General: No focal deficit present.  ?   Mental Status: She is alert.  ? ? ?ED Results / Procedures / Treatments   ?Labs ?(all labs ordered are listed, but only abnormal results are displayed) ?Labs Reviewed  ?BASIC METABOLIC PANEL - Abnormal; Notable for the following components:  ?    Result Value  ? Potassium 3.4 (*)   ? Glucose, Bld 130 (*)   ? All other components within normal limits  ?URINALYSIS, ROUTINE W REFLEX MICROSCOPIC - Abnormal; Notable for the following components:  ? Color, Urine STRAW (*)   ? Specific Gravity, Urine 1.004 (*)   ? All other components within normal limits  ?CBC  ? ? ?EKG ?EKG Interpretation ? ?Date/Time:  Wednesday September 28 2021 22:12:40 EST ?Ventricular Rate:  57 ?PR Interval:  160 ?QRS Duration: 74 ?QT Interval:  442 ?QTC Calculation: 430 ?R Axis:  29 ?Text Interpretation: Sinus bradycardia Otherwise normal ECG When compared with ECG of 27-Oct-2010 09:19, PREVIOUS ECG IS PRESENT Confirmed by Marily Memos [phone]) on 09/28/2021 11:35:31 PM ? ?Radiology ?DG Chest 2 View ? ?Result Date: 09/29/2021 ?CLINICAL DATA:  History of hypertension, presenting with weakness. EXAM: CHEST - 2 VIEW COMPARISON:  October 27, 2010 FINDINGS: Mild, diffuse, chronic appearing increased interstitial lung markings are seen. Mild atelectasis is noted within the left lung base. There is no evidence of a pleural effusion or pneumothorax. The heart size and mediastinal contours are within normal limits. There is mild calcification of the aortic arch. The visualized skeletal structures are unremarkable. IMPRESSION: Chronic appearing  increased interstitial lung markings with mild left basilar atelectasis. Electronically Signed   By: Aram Candela M.D.   On: 09/29/2021 00:01  ? ?CT Head Wo Contrast ? ?Result Date: 09/29/2021 ?CLINICAL DATA:  Headache, new or worsening (Age >= 50y) Lightheaded and dizziness. EXAM: CT HEAD WITHOUT CONTRAST TECHNIQUE: Contiguous axial images were obtained from the base of the skull through the vertex without intravenous contrast. RADIATION DOSE REDUCTION: This exam was performed according to the departmental dose-optimization program which includes automated exposure control, adjustment of the mA and/or kV according to patient size and/or use of iterative reconstruction technique. COMPARISON:  Head CT 11/01/2020 FINDINGS: Brain: Stable degree of atrophy and chronic small vessel ischemia. No intracranial hemorrhage, mass effect, or midline shift. No hydrocephalus. The basilar cisterns are patent. No evidence of territorial infarct or acute ischemia. No extra-axial or intracranial fluid collection. Vascular: Atherosclerosis of skullbase vasculature without hyperdense vessel or abnormal calcification. Skull: No fracture or focal lesion. Sinuses/Orbits: Chronic fluid level in the left maxillary sinus with mucosal thickening. No acute findings. The mastoid air cells are clear. Bilateral cataract resection. Other: None. IMPRESSION: 1. No acute intracranial abnormality. 2. Stable atrophy and chronic small vessel ischemia. Electronically Signed   By: Narda Rutherford M.D.   On: 09/29/2021 00:16   ? ?Procedures ?Procedures  ? ? ?Medications Ordered in ED ?Medications  ?hydrochlorothiazide (HYDRODIURIL) tablet 25 mg (25 mg Oral Given 09/29/21 0043)  ?losartan (COZAAR) tablet 100 mg (0 mg Oral Hold 09/29/21 0040)  ?losartan (COZAAR) tablet 100 mg (100 mg Oral Given 09/29/21 0043)  ? ? ?ED Course/ Medical Decision Making/ A&P ?  ?                        ?Medical Decision Making ?Amount and/or Complexity of Data Reviewed ?Labs:  ordered. ?Radiology: ordered. ? ?Risk ?Prescription drug management. ? ? ?Here with symptoms likely related to taking old metoprolol but will eval for other causes of weakness.  ? ?Patient found to have mild hypokalemia she will supplement her potassium little bit at home. ? ?Rest of work-up negative for any type of causes for her weakness.  Once again I think is more related to taking the metoprolol inappropriately.  We will be observed by her daughter for the next day or 2 and follow-up PCP for further blood pressure control.  Prescription sent ? ?Final Clinical Impression(s) / ED Diagnoses ?Final diagnoses:  ?Hypertension, unspecified type  ? ? ?Rx / DC Orders ?ED Discharge Orders   ? ?      Ordered  ?  losartan-hydrochlorothiazide (HYZAAR) 100-25 MG tablet  Daily       ? 09/29/21 0110  ? ?  ?  ? ?  ? ? ?  ?Marily Memos, MD ?09/29/21 0126 ? ?

## 2021-09-29 ENCOUNTER — Emergency Department (HOSPITAL_COMMUNITY): Payer: Medicare HMO

## 2021-09-29 DIAGNOSIS — I10 Essential (primary) hypertension: Secondary | ICD-10-CM | POA: Diagnosis not present

## 2021-09-29 LAB — URINALYSIS, ROUTINE W REFLEX MICROSCOPIC
Bilirubin Urine: NEGATIVE
Glucose, UA: NEGATIVE mg/dL
Hgb urine dipstick: NEGATIVE
Ketones, ur: NEGATIVE mg/dL
Leukocytes,Ua: NEGATIVE
Nitrite: NEGATIVE
Protein, ur: NEGATIVE mg/dL
Specific Gravity, Urine: 1.004 — ABNORMAL LOW (ref 1.005–1.030)
pH: 7 (ref 5.0–8.0)

## 2021-09-29 IMAGING — CT CT HEAD W/O CM
4 series · 16 of 47 positions shown, 18 images · non-contrast
Comparison: Head CT [DATE]

CLINICAL DATA: Headache, new or worsening (Age >= 50y)

Lightheaded and dizziness.
EXAM:
CT HEAD WITHOUT CONTRAST
TECHNIQUE: Contiguous axial images were obtained from the base of the skull
through the vertex without intravenous contrast.
RADIATION DOSE REDUCTION: This exam was performed according to the
departmental dose-optimization program which includes automated
exposure control, adjustment of the mA and/or kV according to
patient size and/or use of iterative reconstruction technique.

[Series 3: head without · axial · non-contrast · 0.42mm/px · z∈[-180,-60]mm · 7 of 32 slices shown, 9 images]
[im 4/32  brain]
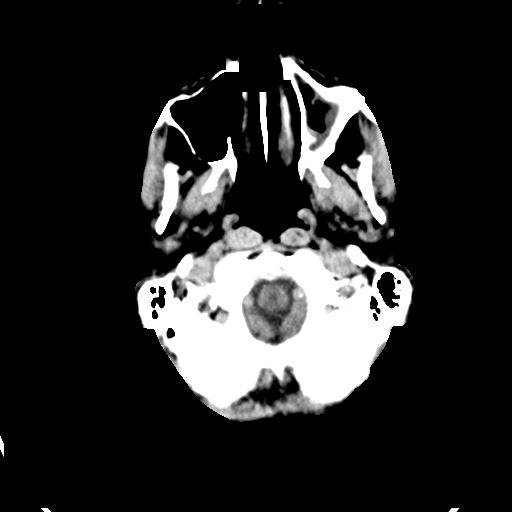
[im 4/32  bone]
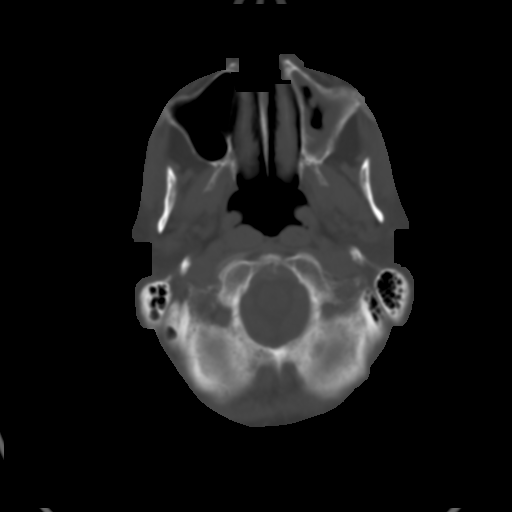
[im 8/32  brain]
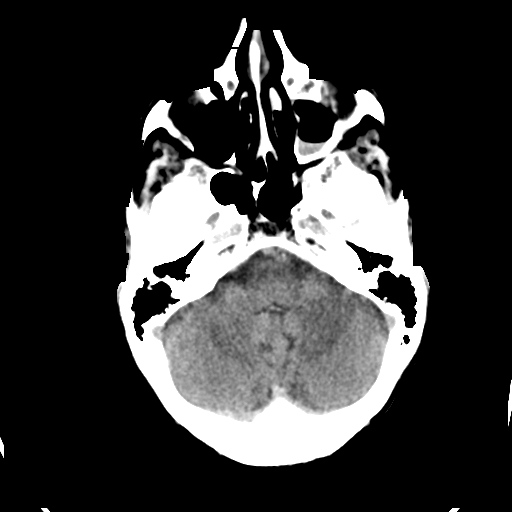
[im 12/32  brain]
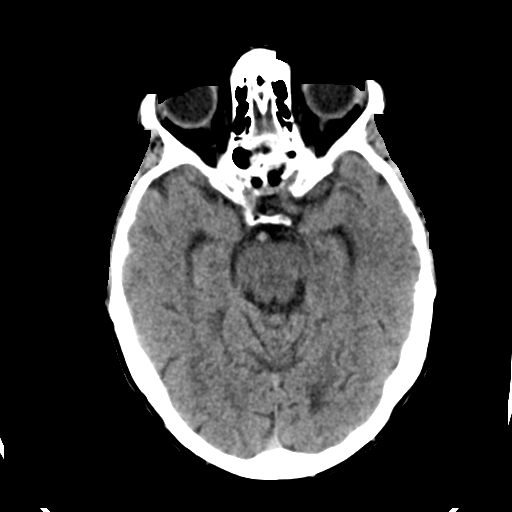
[im 16/32  brain]
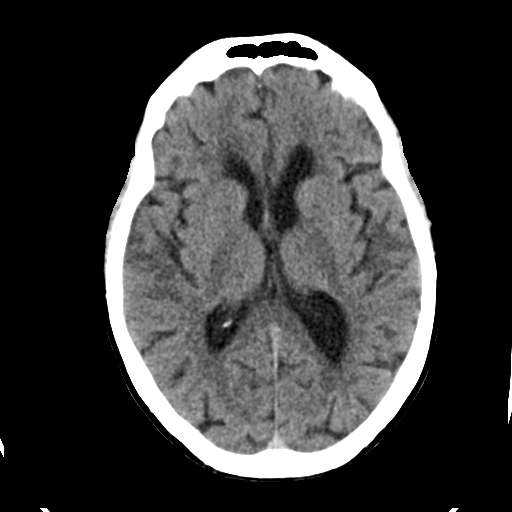
[im 20/32  brain]
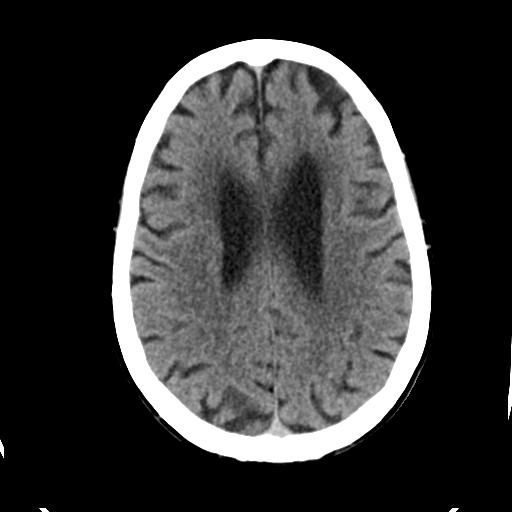
[im 20/32  bone]
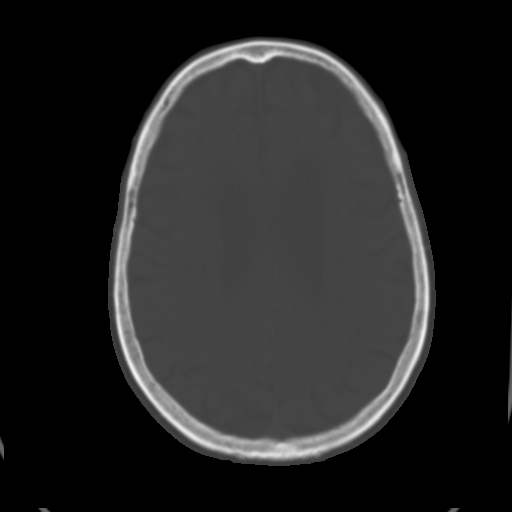
[im 24/32  brain]
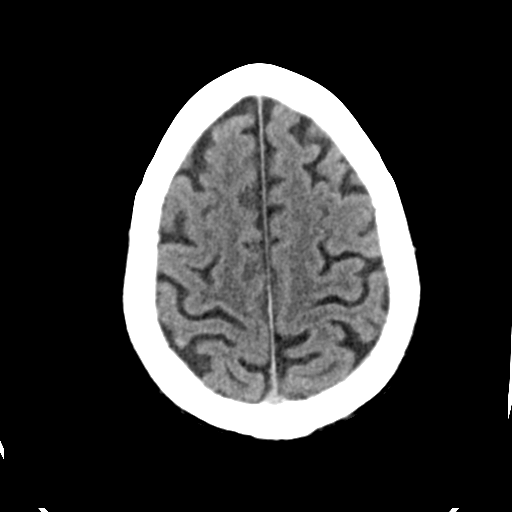
[im 28/32  brain]
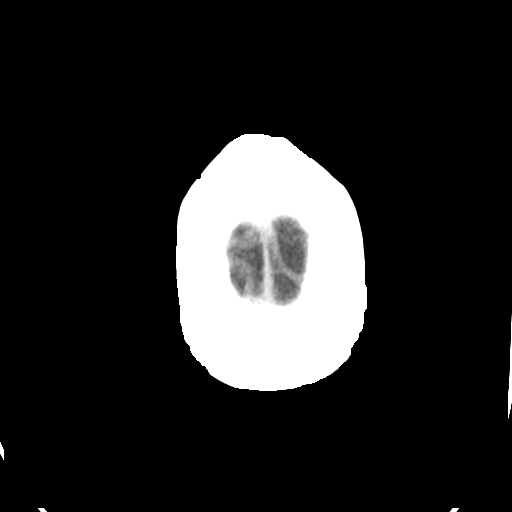

[Series 4: head bone · axial · 0.42mm/px · z∈[-181,-149]mm · 3 of 79 slices shown]
[im 8/79  bone]
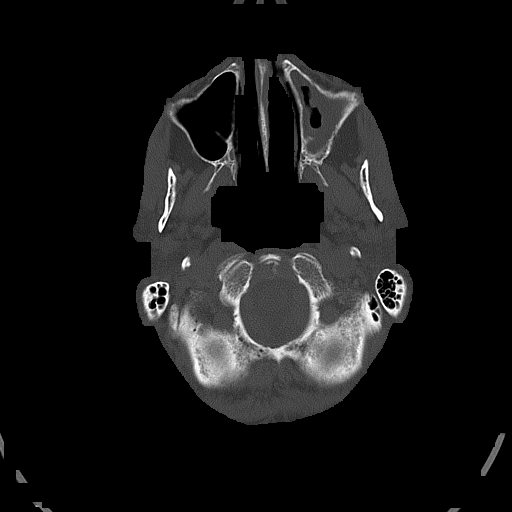
[im 16/79  bone]
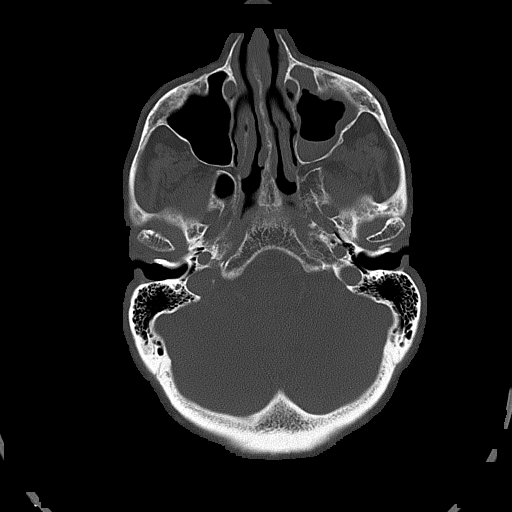
[im 24/79  bone]
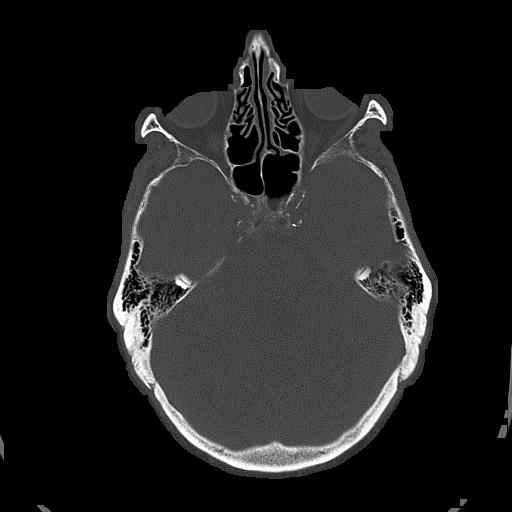

[Series 5: head without cor · coronal · non-contrast · 0.33mm/px · 3 of 68 slices shown]
[im 23/68  brain]
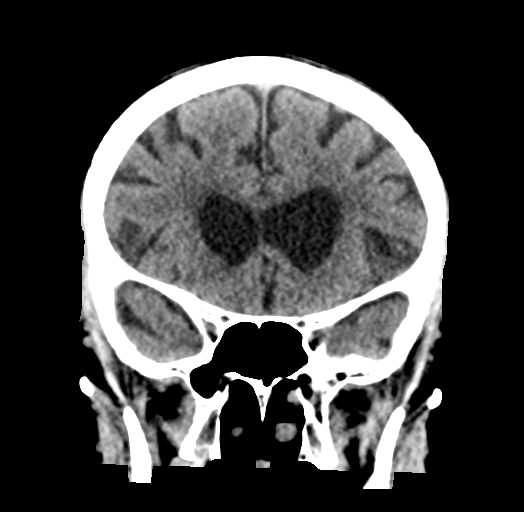
[im 30/68  brain]
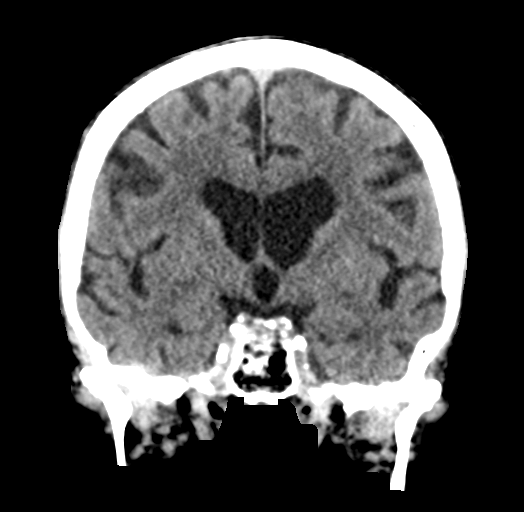
[im 38/68  brain]
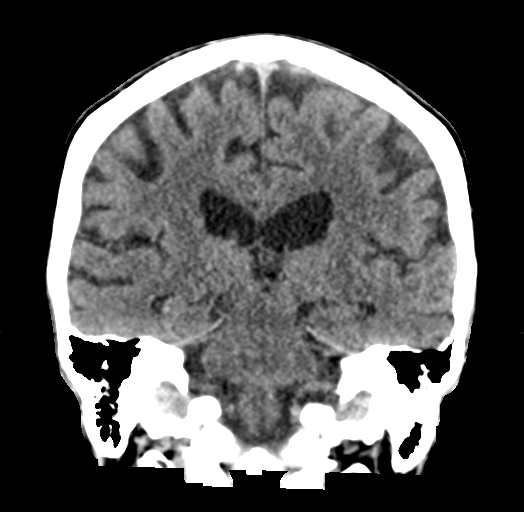

[Series 6: head without sag · sagittal · non-contrast · 0.32mm/px · 3 of 56 slices shown]
[im 19/56  brain]
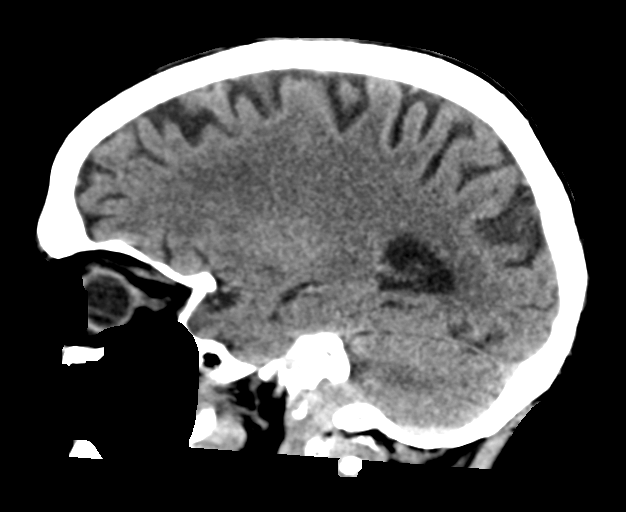
[im 28/56  brain]
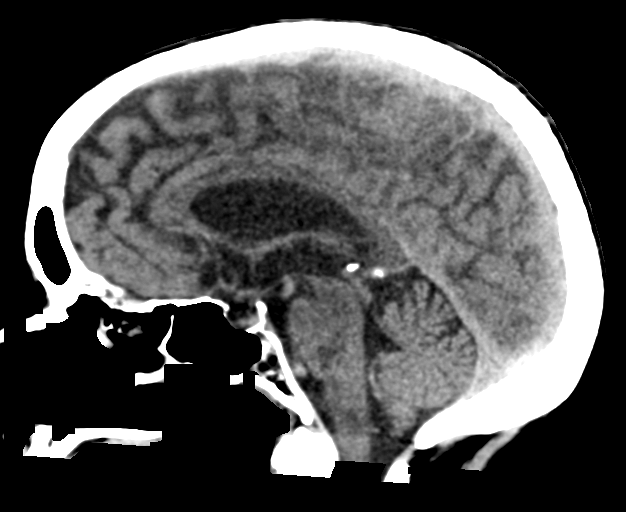
[im 37/56  brain]
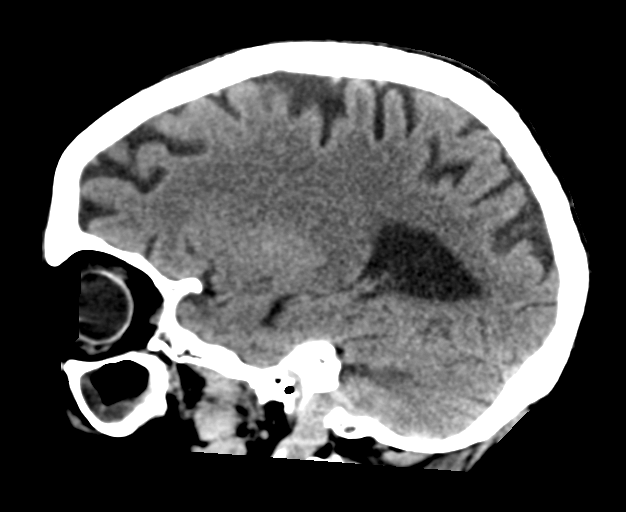

[16 of 47 positions shown; findings below may reference images not displayed]

FINDINGS: Brain: Stable degree of atrophy and chronic small vessel ischemia.
No intracranial hemorrhage, mass effect, or midline shift. No
hydrocephalus. The basilar cisterns are patent. No evidence of
territorial infarct or acute ischemia. No extra-axial or
intracranial fluid collection.

Vascular: Atherosclerosis of skullbase vasculature without
hyperdense vessel or abnormal calcification.

Skull: No fracture or focal lesion.

Sinuses/Orbits: Chronic fluid level in the left maxillary sinus with
mucosal thickening. No acute findings. The mastoid air cells are
clear. Bilateral cataract resection.

Other: None.
IMPRESSION: 1. No acute intracranial abnormality.
2. Stable atrophy and chronic small vessel ischemia.

## 2021-09-29 MED ORDER — LOSARTAN POTASSIUM-HCTZ 100-25 MG PO TABS: 1.0000 | ORAL_TABLET | Freq: Every day | ORAL | 1 refills | Status: DC

## 2021-12-18 ENCOUNTER — Encounter (HOSPITAL_COMMUNITY): Payer: Self-pay

## 2021-12-18 ENCOUNTER — Other Ambulatory Visit: Payer: Self-pay

## 2021-12-18 ENCOUNTER — Inpatient Hospital Stay (HOSPITAL_COMMUNITY)
Admission: EM | Admit: 2021-12-18 | Discharge: 2021-12-20 | DRG: 065 | Disposition: A | Discharge status: Home / Self Care | Payer: Medicare Other | Attending: Internal Medicine | Admitting: Internal Medicine

## 2021-12-18 ENCOUNTER — Emergency Department (HOSPITAL_COMMUNITY): Payer: Medicare Other

## 2021-12-18 DIAGNOSIS — E876 Hypokalemia: Secondary | ICD-10-CM

## 2021-12-18 DIAGNOSIS — Z20822 Contact with and (suspected) exposure to covid-19: Secondary | ICD-10-CM | POA: Diagnosis present

## 2021-12-18 DIAGNOSIS — R29810 Facial weakness: Secondary | ICD-10-CM | POA: Diagnosis present

## 2021-12-18 DIAGNOSIS — Z7984 Long term (current) use of oral hypoglycemic drugs: Secondary | ICD-10-CM

## 2021-12-18 DIAGNOSIS — I639 Cerebral infarction, unspecified: Principal | ICD-10-CM

## 2021-12-18 DIAGNOSIS — G8191 Hemiplegia, unspecified affecting right dominant side: Secondary | ICD-10-CM | POA: Diagnosis present

## 2021-12-18 DIAGNOSIS — R531 Weakness: Secondary | ICD-10-CM | POA: Diagnosis not present

## 2021-12-18 DIAGNOSIS — M4802 Spinal stenosis, cervical region: Secondary | ICD-10-CM | POA: Diagnosis present

## 2021-12-18 DIAGNOSIS — F419 Anxiety disorder, unspecified: Secondary | ICD-10-CM | POA: Diagnosis present

## 2021-12-18 DIAGNOSIS — E782 Mixed hyperlipidemia: Secondary | ICD-10-CM

## 2021-12-18 DIAGNOSIS — I6389 Other cerebral infarction: Secondary | ICD-10-CM | POA: Diagnosis not present

## 2021-12-18 DIAGNOSIS — K219 Gastro-esophageal reflux disease without esophagitis: Secondary | ICD-10-CM | POA: Diagnosis present

## 2021-12-18 DIAGNOSIS — E785 Hyperlipidemia, unspecified: Secondary | ICD-10-CM | POA: Diagnosis present

## 2021-12-18 DIAGNOSIS — Z79899 Other long term (current) drug therapy: Secondary | ICD-10-CM

## 2021-12-18 DIAGNOSIS — I10 Essential (primary) hypertension: Secondary | ICD-10-CM

## 2021-12-18 DIAGNOSIS — E119 Type 2 diabetes mellitus without complications: Secondary | ICD-10-CM

## 2021-12-18 LAB — DIFFERENTIAL
Abs Immature Granulocytes: 0.01 10*3/uL (ref 0.00–0.07)
Basophils Absolute: 0 10*3/uL (ref 0.0–0.1)
Basophils Relative: 1 %
Eosinophils Absolute: 0.1 10*3/uL (ref 0.0–0.5)
Eosinophils Relative: 1 %
Immature Granulocytes: 0 %
Lymphocytes Relative: 20 %
Lymphs Abs: 1.2 10*3/uL (ref 0.7–4.0)
Monocytes Absolute: 0.7 10*3/uL (ref 0.1–1.0)
Monocytes Relative: 12 %
Neutro Abs: 3.9 10*3/uL (ref 1.7–7.7)
Neutrophils Relative %: 66 %

## 2021-12-18 LAB — I-STAT CHEM 8, ED
BUN: 17 mg/dL (ref 8–23)
Calcium, Ion: 1.05 mmol/L — ABNORMAL LOW (ref 1.15–1.40)
Chloride: 104 mmol/L (ref 98–111)
Creatinine, Ser: 0.9 mg/dL (ref 0.44–1.00)
Glucose, Bld: 171 mg/dL — ABNORMAL HIGH (ref 70–99)
HCT: 43 % (ref 36.0–46.0)
Hemoglobin: 14.6 g/dL (ref 12.0–15.0)
Potassium: 3.1 mmol/L — ABNORMAL LOW (ref 3.5–5.1)
Sodium: 141 mmol/L (ref 135–145)
TCO2: 28 mmol/L (ref 22–32)

## 2021-12-18 LAB — COMPREHENSIVE METABOLIC PANEL
ALT: 13 U/L (ref 0–44)
AST: 24 U/L (ref 15–41)
Albumin: 3.4 g/dL — ABNORMAL LOW (ref 3.5–5.0)
Alkaline Phosphatase: 66 U/L (ref 38–126)
Anion gap: 7 (ref 5–15)
BUN: 15 mg/dL (ref 8–23)
CO2: 27 mmol/L (ref 22–32)
Calcium: 8.8 mg/dL — ABNORMAL LOW (ref 8.9–10.3)
Chloride: 105 mmol/L (ref 98–111)
Creatinine, Ser: 0.99 mg/dL (ref 0.44–1.00)
GFR, Estimated: 57 mL/min — ABNORMAL LOW (ref >60)
Glucose, Bld: 179 mg/dL — ABNORMAL HIGH (ref 70–99)
Potassium: 3.1 mmol/L — ABNORMAL LOW (ref 3.5–5.1)
Sodium: 139 mmol/L (ref 135–145)
Total Bilirubin: 0.9 mg/dL (ref 0.3–1.2)
Total Protein: 6.6 g/dL (ref 6.5–8.1)

## 2021-12-18 LAB — I-STAT VENOUS BLOOD GAS, ED
Acid-Base Excess: 4 mmol/L — ABNORMAL HIGH (ref 0.0–2.0)
Bicarbonate: 28 mmol/L (ref 20.0–28.0)
Calcium, Ion: 1.07 mmol/L — ABNORMAL LOW (ref 1.15–1.40)
HCT: 40 % (ref 36.0–46.0)
Hemoglobin: 13.6 g/dL (ref 12.0–15.0)
O2 Saturation: 68 %
Potassium: 3.1 mmol/L — ABNORMAL LOW (ref 3.5–5.1)
Sodium: 141 mmol/L (ref 135–145)
TCO2: 29 mmol/L (ref 22–32)
pCO2, Ven: 39.2 mmHg — ABNORMAL LOW (ref 44–60)
pH, Ven: 7.462 — ABNORMAL HIGH (ref 7.25–7.43)
pO2, Ven: 34 mmHg (ref 32–45)

## 2021-12-18 LAB — URINALYSIS, ROUTINE W REFLEX MICROSCOPIC
Bilirubin Urine: NEGATIVE
Glucose, UA: 150 mg/dL — AB
Hgb urine dipstick: NEGATIVE
Ketones, ur: NEGATIVE mg/dL
Leukocytes,Ua: NEGATIVE
Nitrite: NEGATIVE
Protein, ur: NEGATIVE mg/dL
Specific Gravity, Urine: 1.008 (ref 1.005–1.030)
pH: 7 (ref 5.0–8.0)

## 2021-12-18 LAB — TSH: TSH: 1.675 u[IU]/mL (ref 0.350–4.500)

## 2021-12-18 LAB — RESP PANEL BY RT-PCR (FLU A&B, COVID) ARPGX2
Influenza A by PCR: NEGATIVE
Influenza B by PCR: NEGATIVE
SARS Coronavirus 2 by RT PCR: NEGATIVE

## 2021-12-18 LAB — RAPID URINE DRUG SCREEN, HOSP PERFORMED
Amphetamines: NOT DETECTED
Barbiturates: NOT DETECTED
Benzodiazepines: NOT DETECTED
Cocaine: NOT DETECTED
Opiates: NOT DETECTED
Tetrahydrocannabinol: NOT DETECTED

## 2021-12-18 LAB — PROTIME-INR
INR: 1.1 (ref 0.8–1.2)
Prothrombin Time: 13.7 seconds (ref 11.4–15.2)

## 2021-12-18 LAB — ETHANOL: Alcohol, Ethyl (B): <10 mg/dL (ref <10)

## 2021-12-18 LAB — CBC
HCT: 40.5 % (ref 36.0–46.0)
Hemoglobin: 13.8 g/dL (ref 12.0–15.0)
MCH: 29.4 pg (ref 26.0–34.0)
MCHC: 34.1 g/dL (ref 30.0–36.0)
MCV: 86.2 fL (ref 80.0–100.0)
Platelets: 156 10*3/uL (ref 150–400)
RBC: 4.7 MIL/uL (ref 3.87–5.11)
RDW: 13.3 % (ref 11.5–15.5)
WBC: 5.8 10*3/uL (ref 4.0–10.5)
nRBC: 0 % (ref 0.0–0.2)

## 2021-12-18 LAB — APTT: aPTT: 29 seconds (ref 24–36)

## 2021-12-18 LAB — HEMOGLOBIN A1C
Hgb A1c MFr Bld: 6.6 % — ABNORMAL HIGH (ref 4.8–5.6)
Mean Plasma Glucose: 142.72 mg/dL

## 2021-12-18 IMAGING — CT CT HEAD W/O CM
4 series · 16 of 47 positions shown, 18 images · non-contrast
Comparison: [DATE]

CLINICAL DATA: Neuro deficit, acute stroke suspected



[Series 2: head wo · axial · 0.42mm/px · z∈[+1550,+1666]mm · 7 of 31 slices shown, 9 images]
[im 4/31  brain]
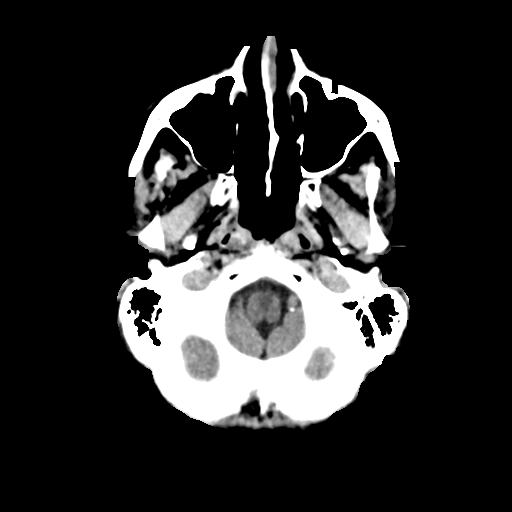
[im 4/31  bone]
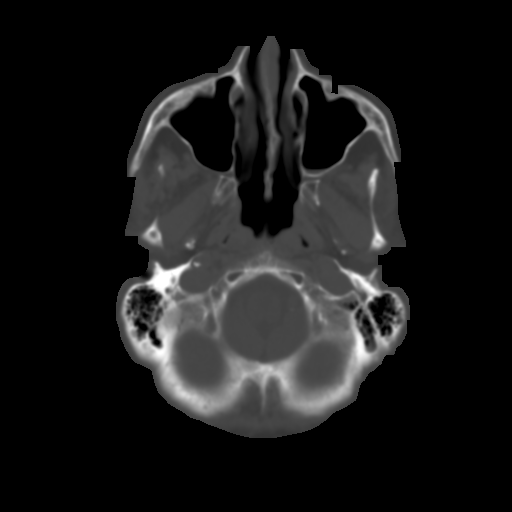
[im 8/31  brain]
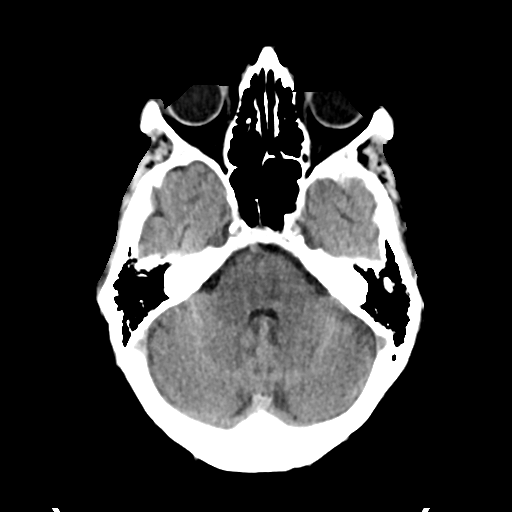
[im 12/31  brain]
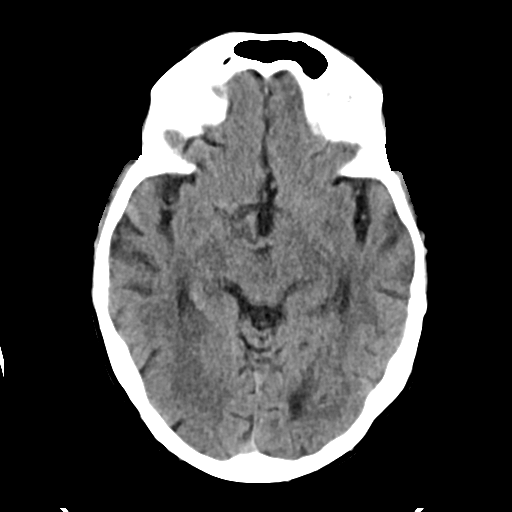
[im 16/31  brain]
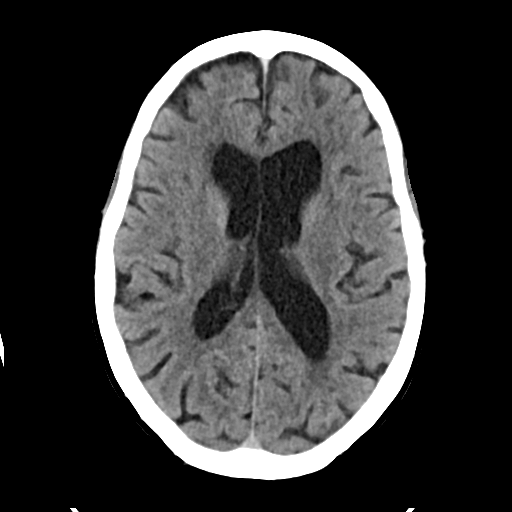
[im 19/31  brain]
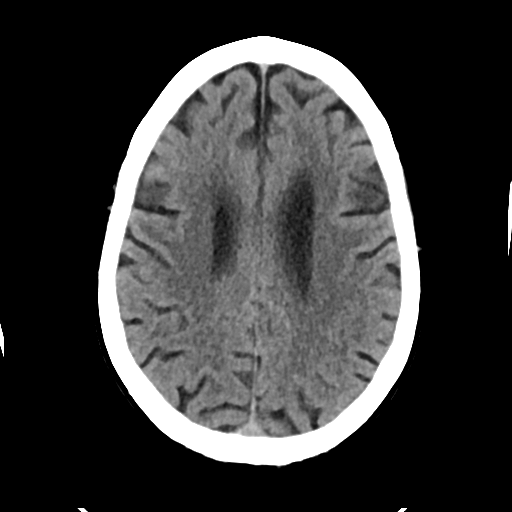
[im 19/31  bone]
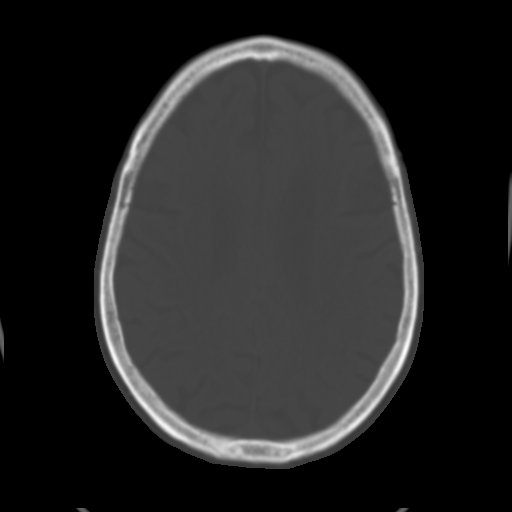
[im 23/31  brain]
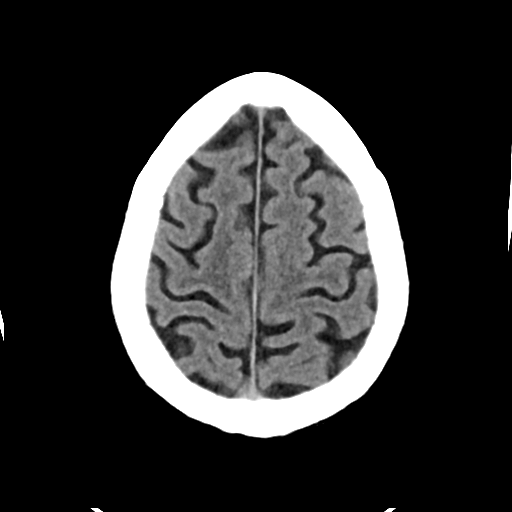
[im 27/31  brain]
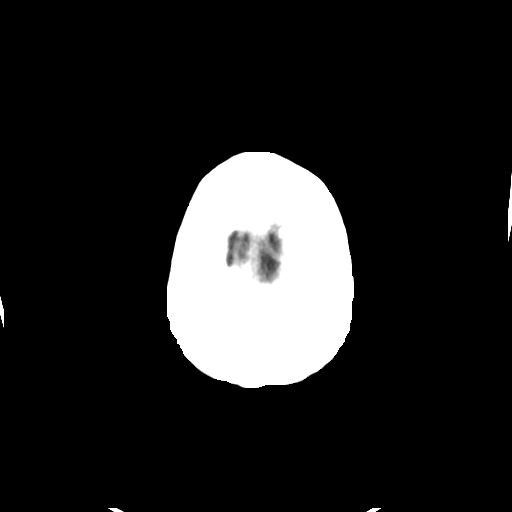

[Series 3: head bone · axial · 0.42mm/px · z∈[+1550,+1580]mm · 3 of 77 slices shown]
[im 8/77  bone]
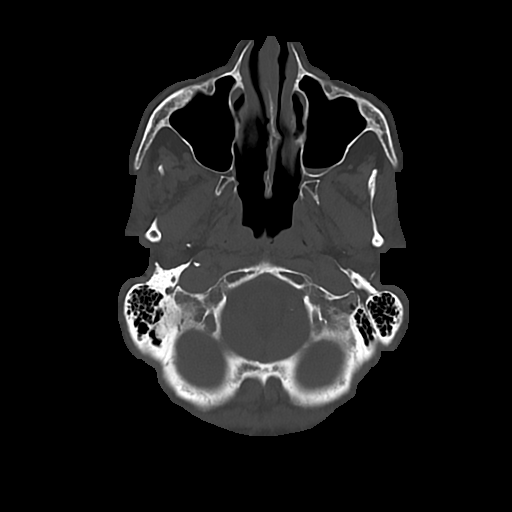
[im 16/77  bone]
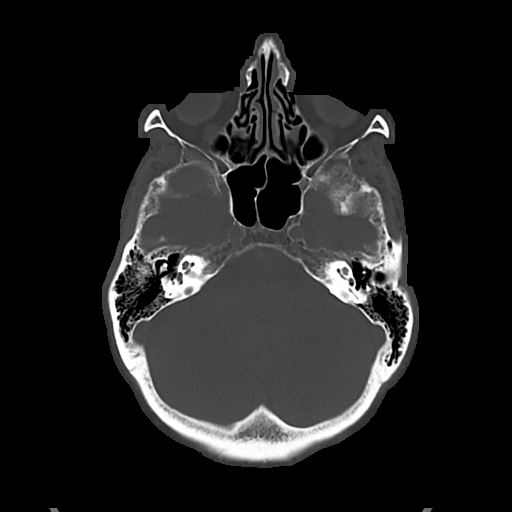
[im 23/77  bone]
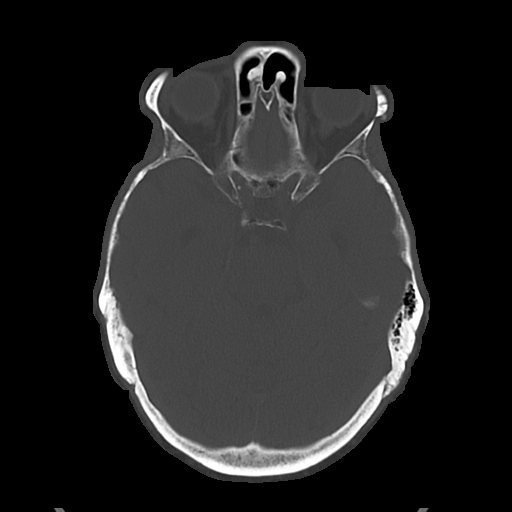

[Series 4: cor soft · coronal · 0.27mm/px · 3 of 69 slices shown]
[im 23/69  brain]
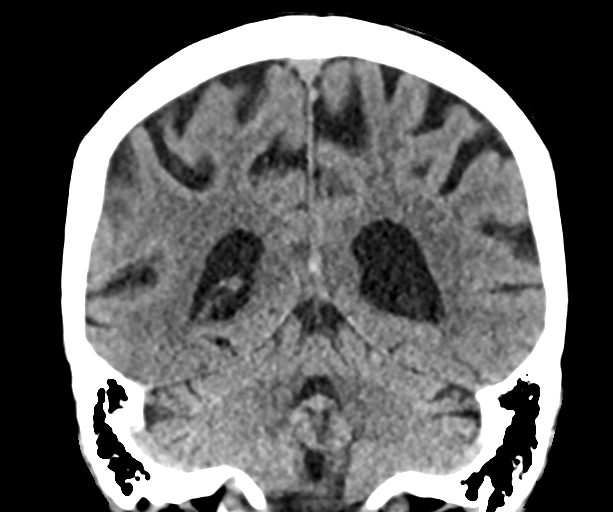
[im 31/69  brain]
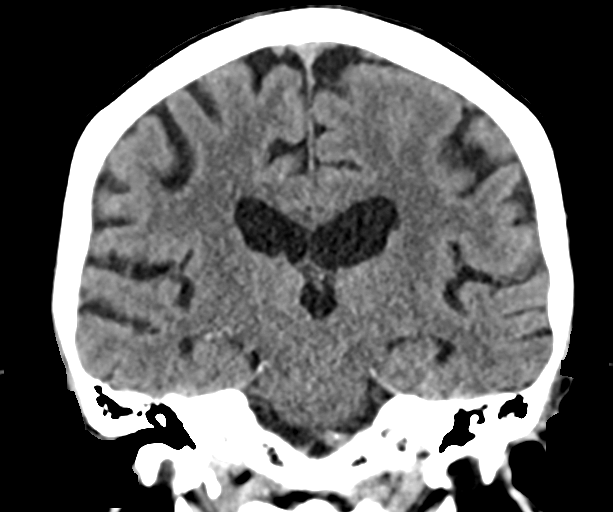
[im 38/69  brain]
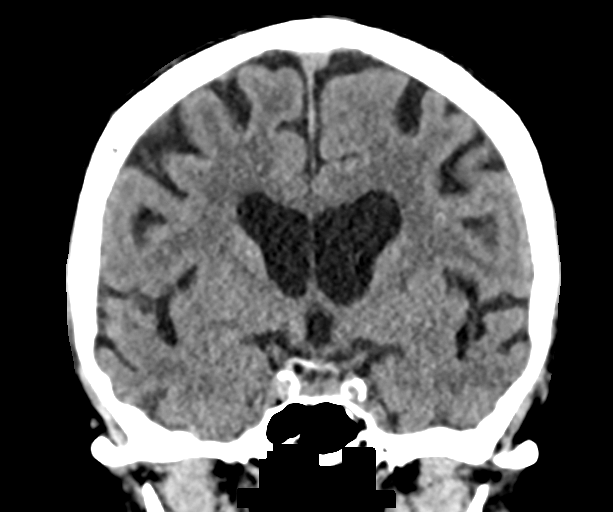

[Series 5: sag soft · sagittal · 0.27mm/px · 3 of 55 slices shown]
[im 19/55  brain]
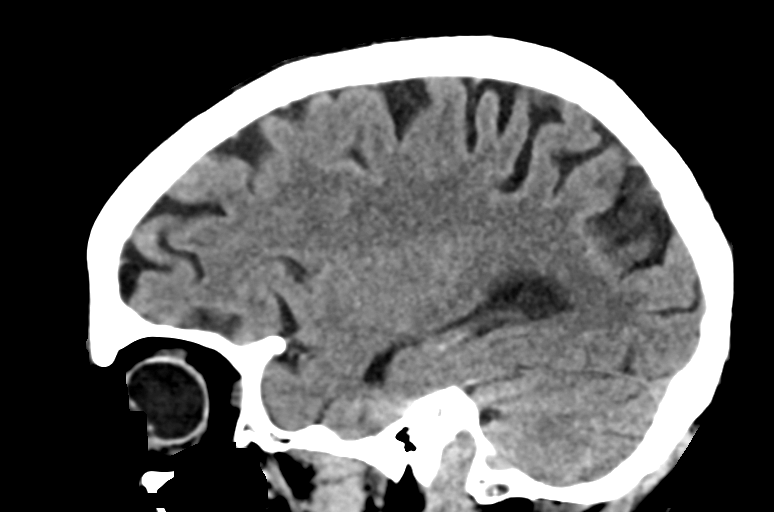
[im 28/55  brain]
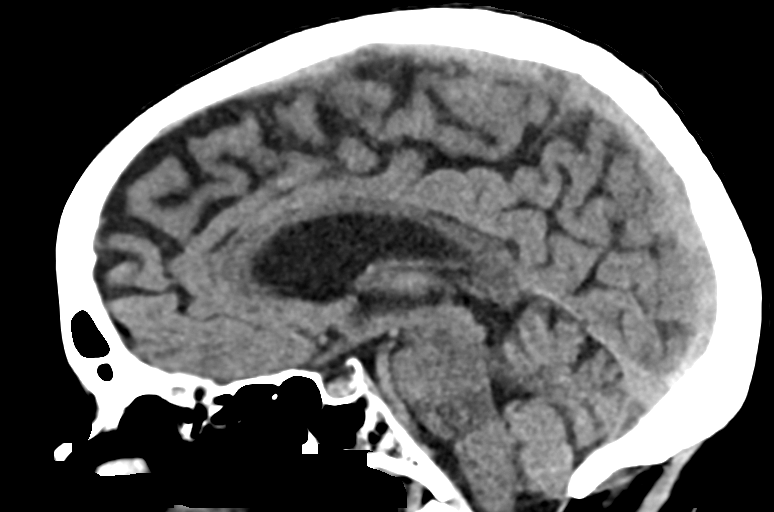
[im 37/55  brain]
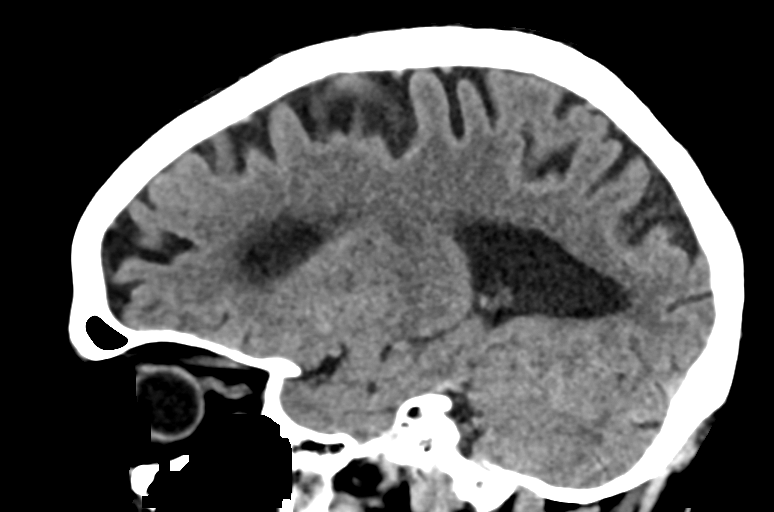

[16 of 47 positions shown; findings below may reference images not displayed]

FINDINGS: Brain: No evidence of acute infarction, hemorrhage, hydrocephalus,
extra-axial collection or mass lesion/mass effect.

Vascular: No hyperdense vessel or unexpected calcification.

Skull: Normal. Negative for fracture or focal lesion.

Sinuses/Orbits: No acute finding.

Other: None.
IMPRESSION: No acute intracranial pathology.

## 2021-12-18 IMAGING — MR MR HEAD W/O CM
6 of 10 series · 28 of 48 positions shown · non-contrast
Comparison: CT head from the same day.

CLINICAL DATA: Neuro deficit, acute, stroke suspected

EXAM:
MRI HEAD WITHOUT CONTRAST
TECHNIQUE: Multiplanar, multiecho pulse sequences of the brain and surrounding
structures were obtained without intravenous contrast.

[Series 2: DWI · axial · 3.0mm · 0.94mm/px · z∈[-107,+34]mm · 8 of 98 slices shown (1 of 2)]
[im 1/98]
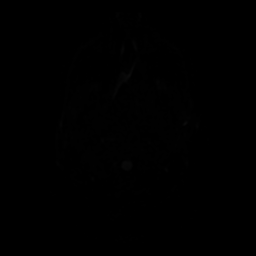
[im 11/98]
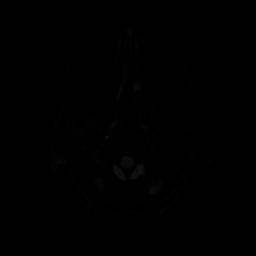
[im 33/98]
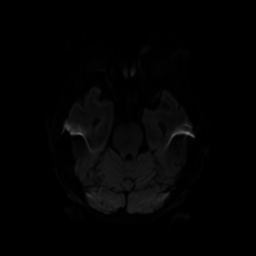
[im 44/98]
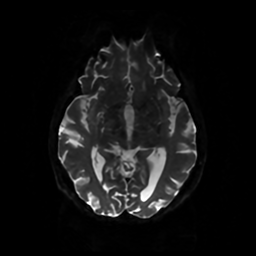
[im 54/98]
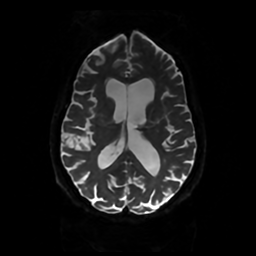
[im 65/98]
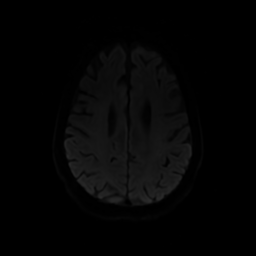
[im 87/98]
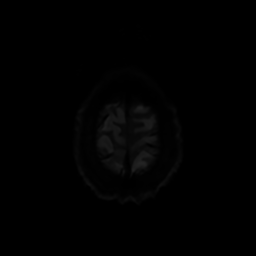
[im 98/98]
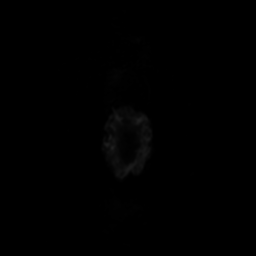

[Series 3: DWI · coronal · 4.0mm · 0.94mm/px · 7 of 74 slices shown (2 of 2)]
[im 1/74]
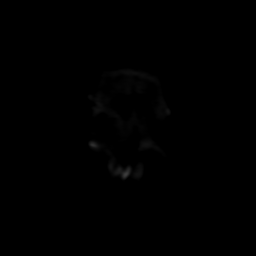
[im 13/74]
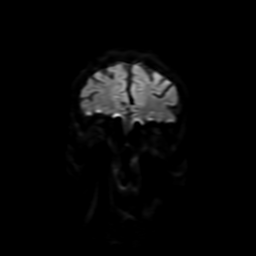
[im 25/74]
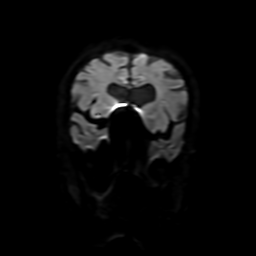
[im 37/74]
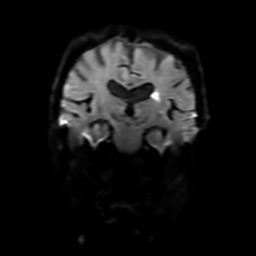
[im 49/74]
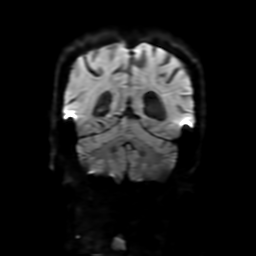
[im 61/74]
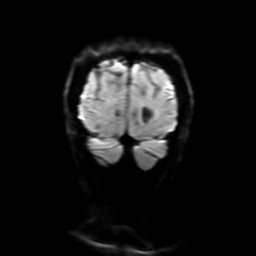
[im 74/74]
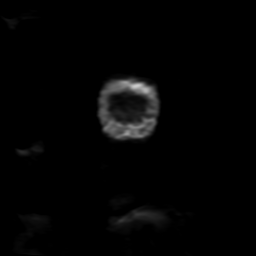

[Series 4: FLAIR · sagittal · 5.0mm · 0.23mm/px · 2 of 23 slices shown (1 of 2)]
[im 1/23]
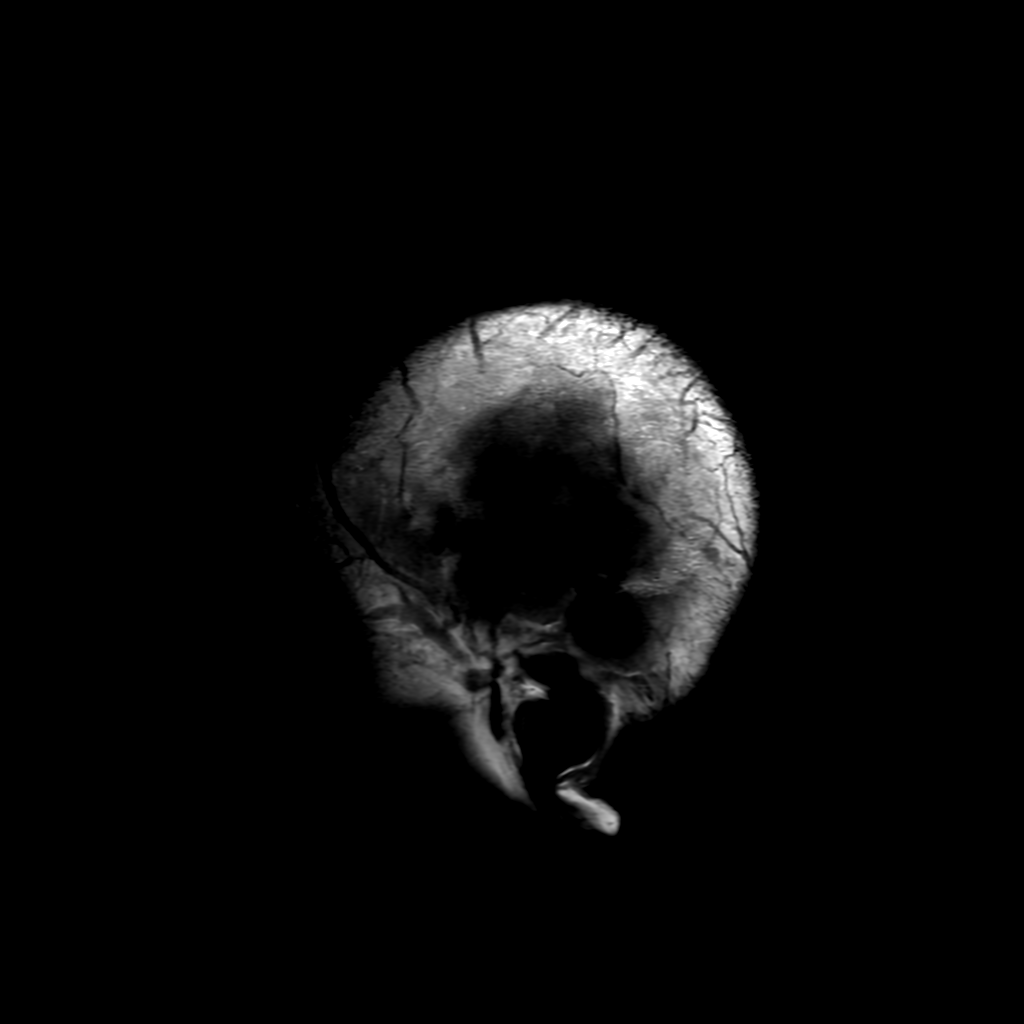
[im 23/23]
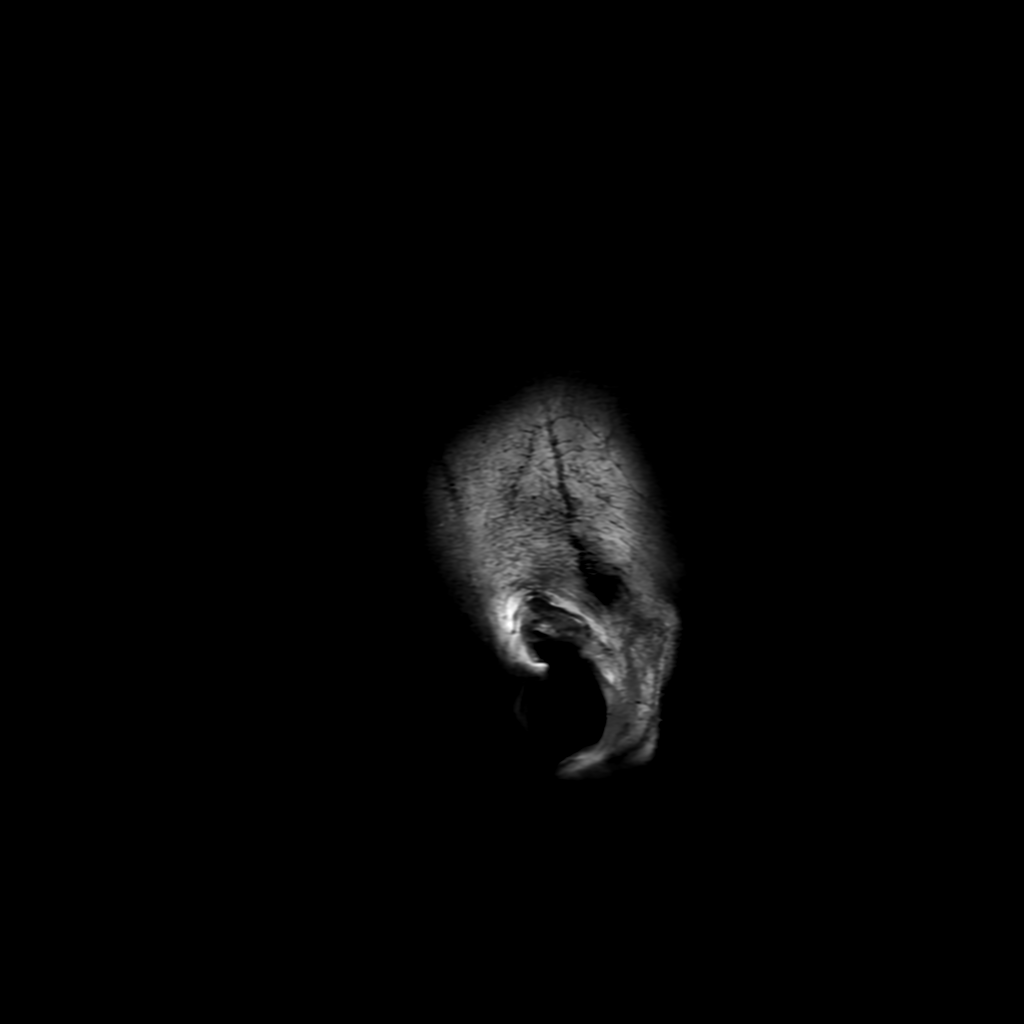

[Series 6: FLAIR · axial · 4.0mm · 0.45mm/px · z∈[-110,+33]mm · 3 of 34 slices shown (2 of 2)]
[im 1/34]
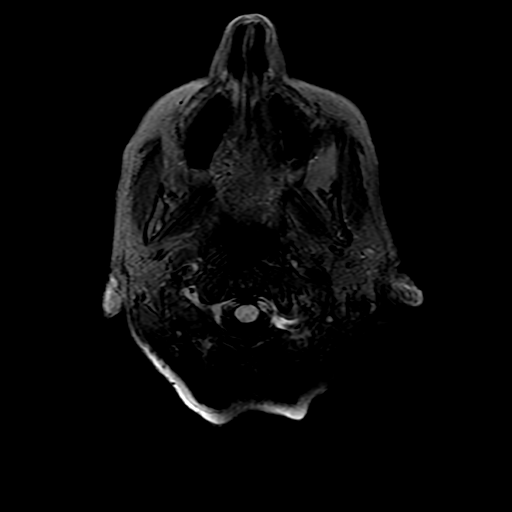
[im 17/34]
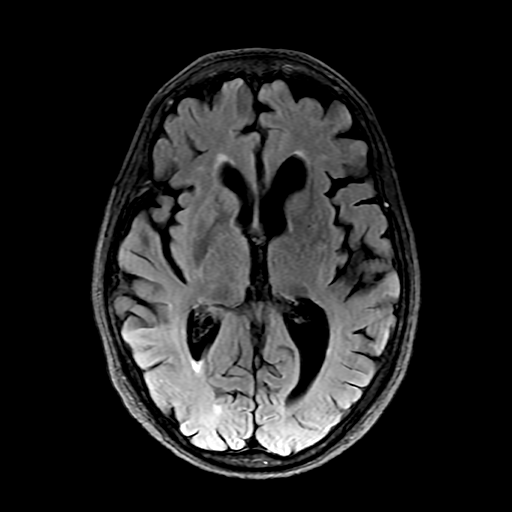
[im 34/34]
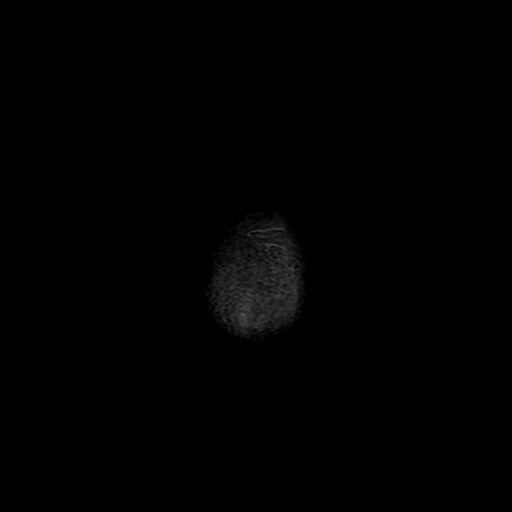

[Series 250: ADC · axial · 3.0mm · 0.94mm/px · z∈[-107,+34]mm · 5 of 49 slices shown (1 of 2)]
[im 1/49]
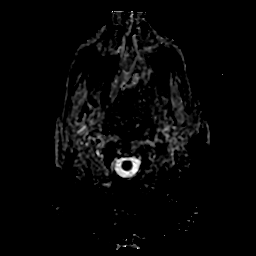
[im 13/49]
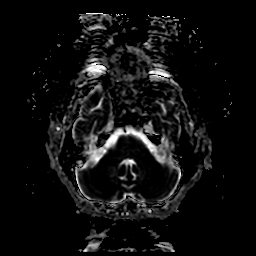
[im 25/49]
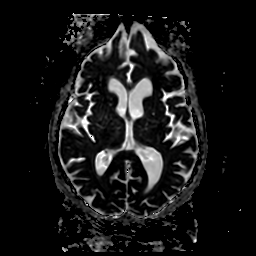
[im 37/49]
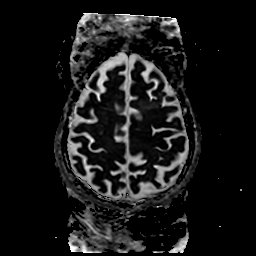
[im 49/49]
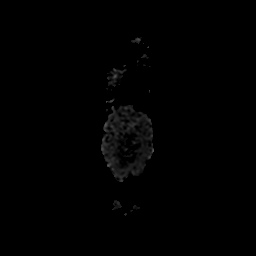

[Series 350: ADC · coronal · 4.0mm · 0.94mm/px · 3 of 36 slices shown (2 of 2)]
[im 1/36]
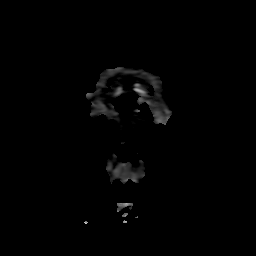
[im 18/36]
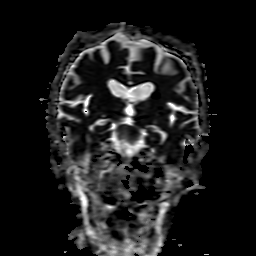
[im 36/36]
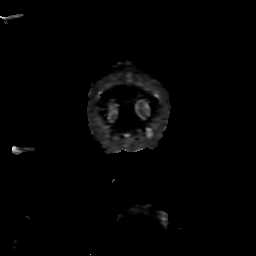

[28 of 48 positions shown; findings below may reference images not displayed]

FINDINGS: Brain: Multiple small acute perforator infarcts in the left basal
ganglia. Mild edema without mass effect. Additional mild scattered
T2/FLAIR hyperintensities in the white matter, compatible with
chronic microvascular ischemic disease as mild for age. Cerebral
atrophy. No mass lesion, hydrocephalus, acute hemorrhage, or
extra-axial fluid collection.

Vascular: Major arterial flow voids are maintained skull base.

Skull and upper cervical spine: Normal marrow signal.

Sinuses/Orbits: Mild paranasal sinus mucosal thickening. No acute
orbital findings.

Other: No mastoid effusions.
IMPRESSION: 1. Multiple small acute perforator infarcts in the left basal
ganglia.
2.  Cerebral atrophy ([K3]-[K3]).

## 2021-12-18 IMAGING — MR MR CERVICAL SPINE W/O CM
4 of 5 series · 19 of 48 positions shown · non-contrast
Comparison: None Available.

CLINICAL DATA: Cervical radiculopathy, no red flags

EXAM:
MRI CERVICAL SPINE WITHOUT CONTRAST
TECHNIQUE: Multiplanar, multisequence MR imaging of the cervical spine was
performed. No intravenous contrast was administered.

[Series 3: T2 · sagittal · 3.0mm · 0.35mm/px · 5 of 17 slices shown (1 of 2)]
[im 1/17]
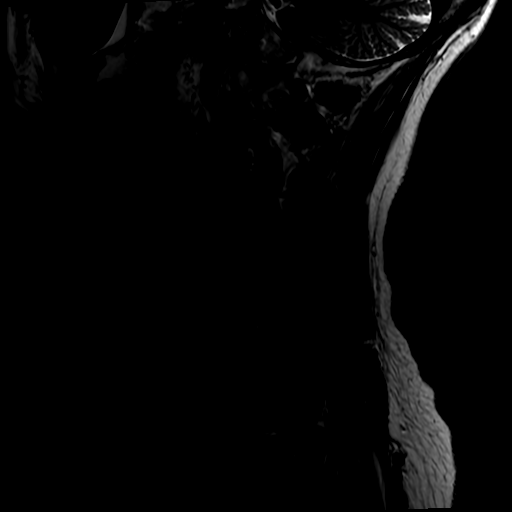
[im 5/17]
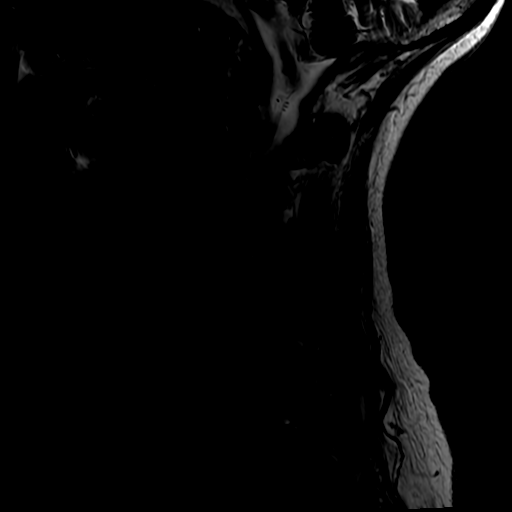
[im 9/17]
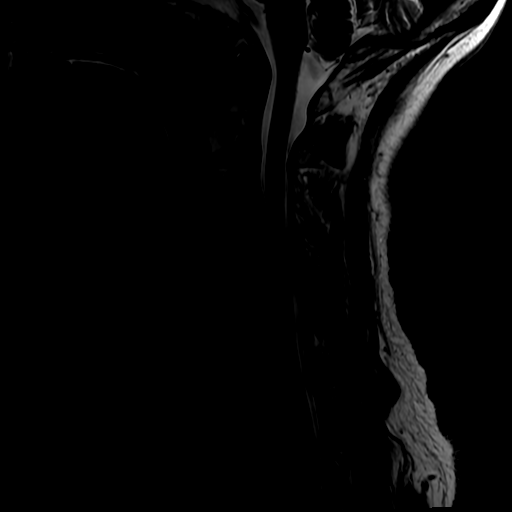
[im 13/17]
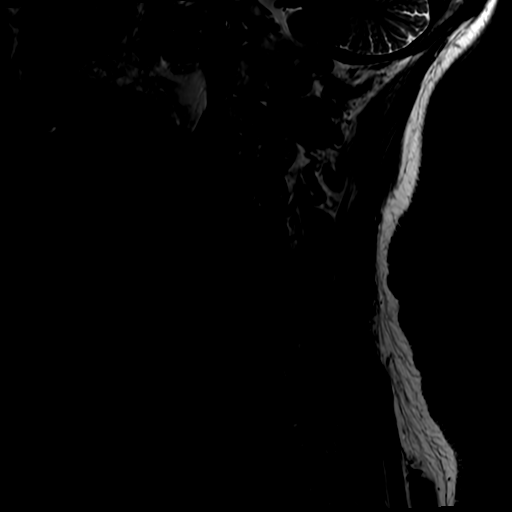
[im 17/17]
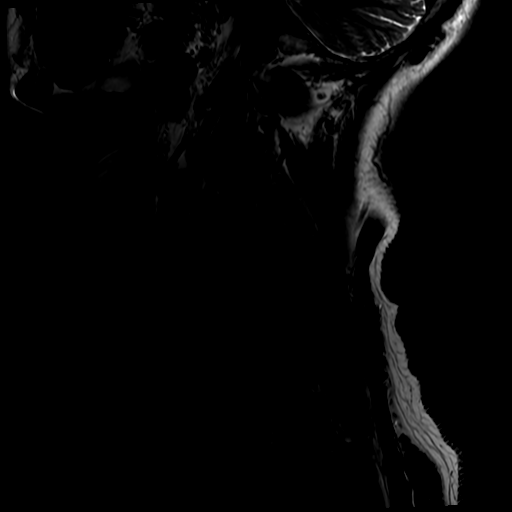

[Series 4: FLAIR · sagittal · 3.0mm · 0.35mm/px · 3 of 17 slices shown]
[im 1/17]
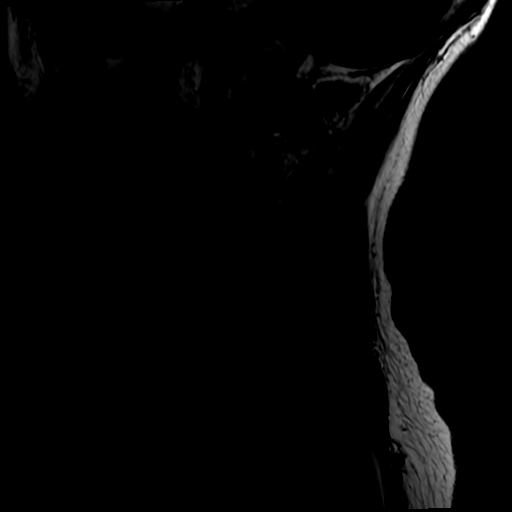
[im 11/17]
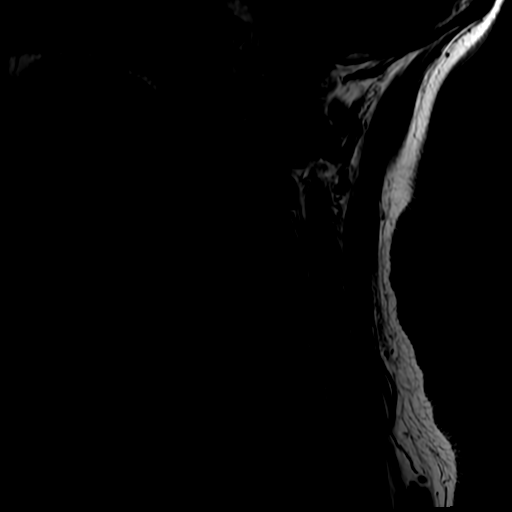
[im 17/17]
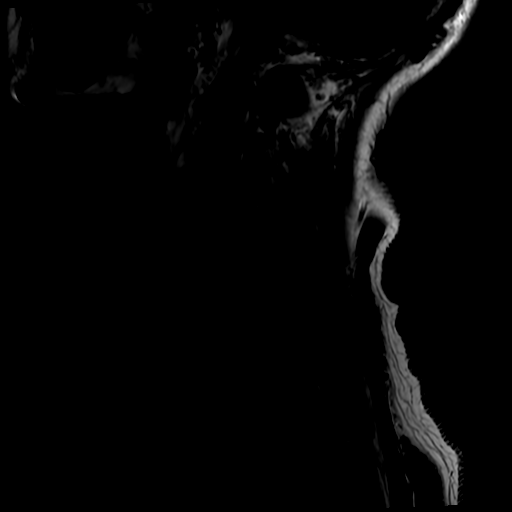

[Series 5: STIR · sagittal · 3.0mm · 0.35mm/px · 3 of 17 slices shown]
[im 1/17]
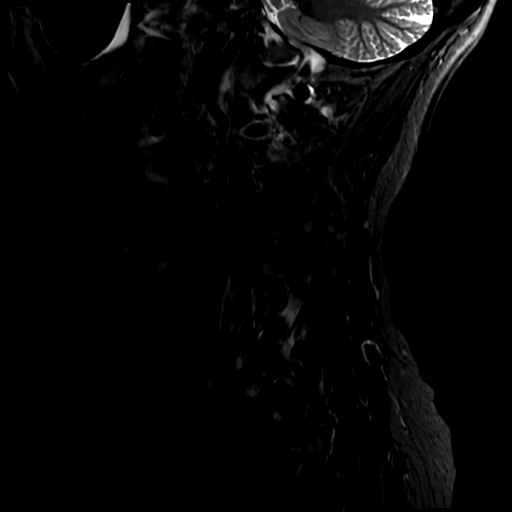
[im 11/17]
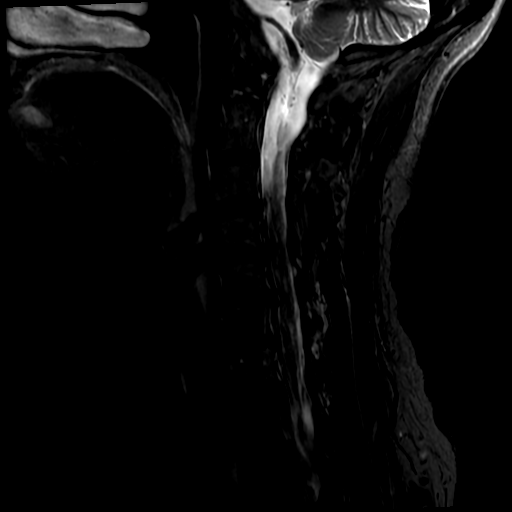
[im 17/17]
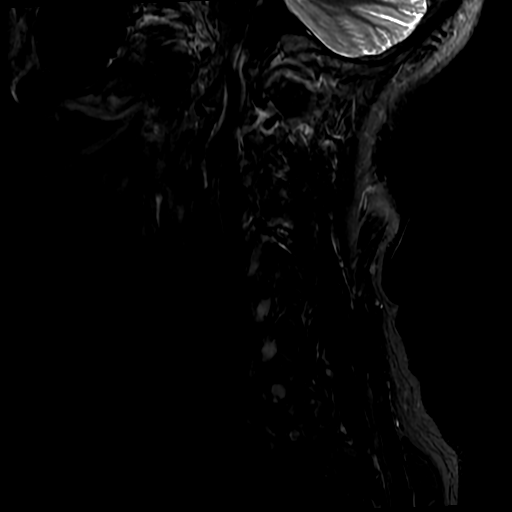

[Series 7: T2 · axial · 3.0mm · 0.35mm/px · z∈[-219,-127]mm · 8 of 34 slices shown (2 of 2)]
[im 1/34]
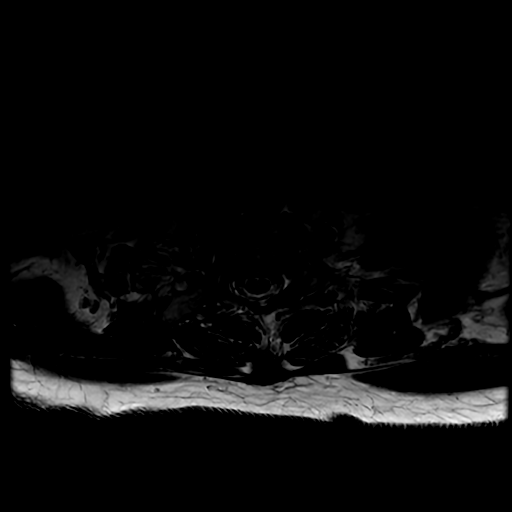
[im 5/34]
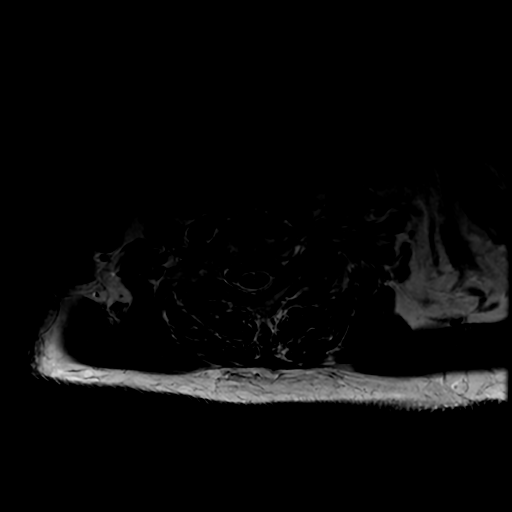
[im 9/34]
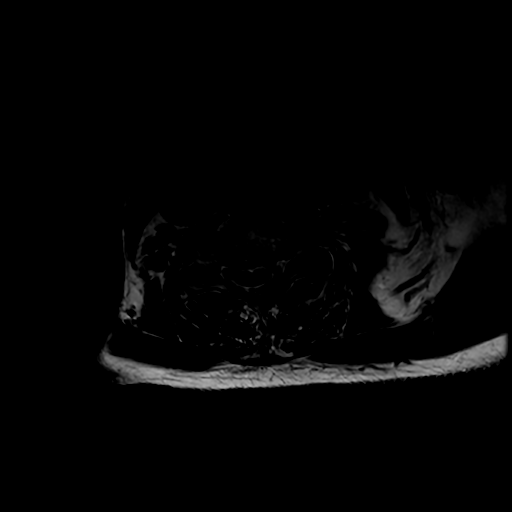
[im 13/34]
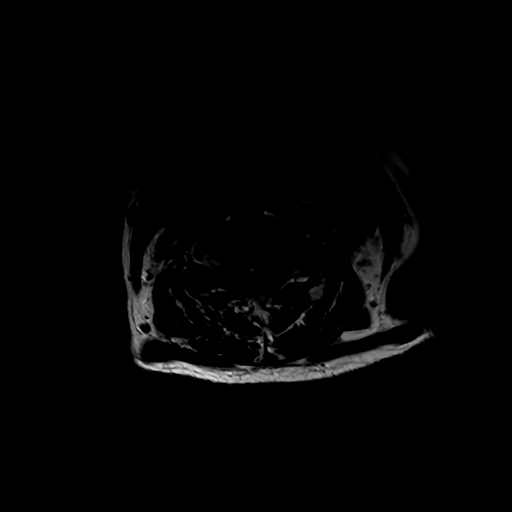
[im 17/34]
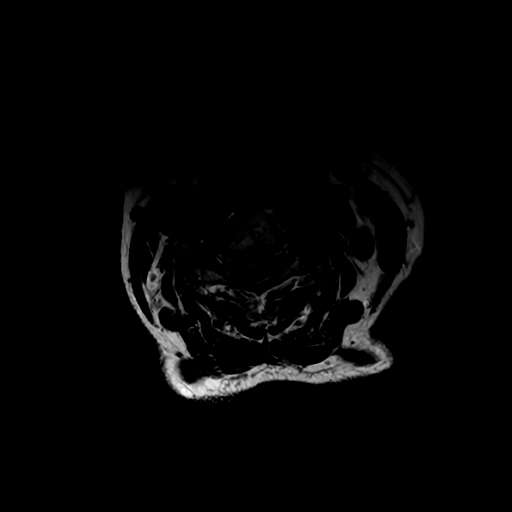
[im 21/34]
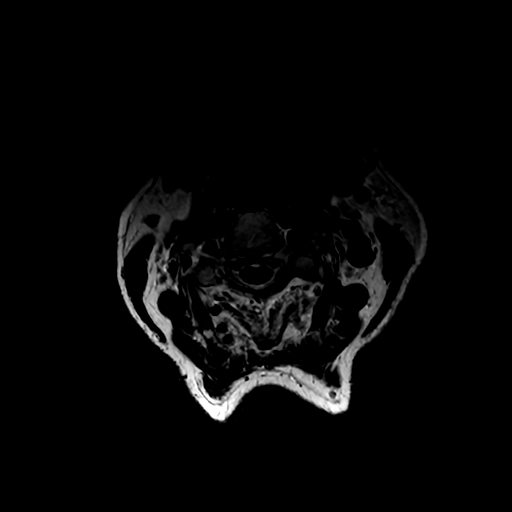
[im 25/34]
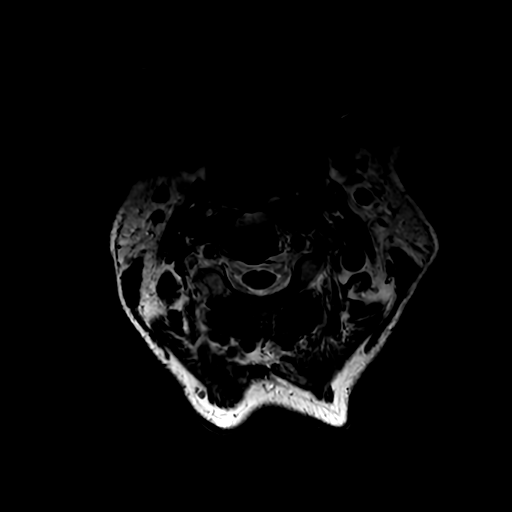
[im 29/34]
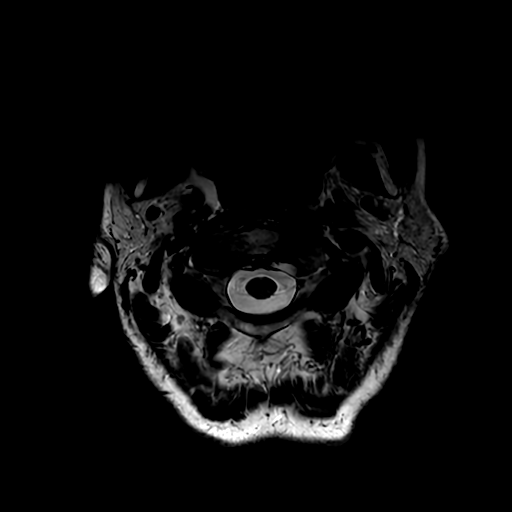

[19 of 48 positions shown; findings below may reference images not displayed]

FINDINGS: Alignment: Slight anterolisthesis of C4 on C5 and C5 on C6.

Vertebrae: Vertebral body heights are maintained. No focal marrow
edema to suggest acute fracture discitis/osteomyelitis. No
suspicious bone lesions.

Cord: Normal cord signal.

Posterior Fossa, vertebral arteries, paraspinal tissues: Visualized
vertebral artery flow voids are maintained. Unremarkable appearance
of the visualized posterior fossa.

Disc levels:

C2-C3: Mild posterior disc osteophyte complex without significant
canal or foraminal stenosis.

C3-C4: Posterior disc osteophyte complex thigh for hand lesion which
flattens the ventral cord with mild canal stenosis. No significant
foraminal stenosis.

C4-C5: Posterior disc osteophyte complex. Left facet uncovertebral
hypertrophy. Mild canal and left foraminal stenosis.

C5-C6: Posterior disc osteophyte complex with left greater than
right facet and uncovertebral hypertrophy. Resulting
mild-to-moderate left foraminal stenosis with mild canal stenosis.

C6-C7: Posterior disc osteophyte complex. Left greater than right
facet and uncovertebral hypertrophy. Resulting mild left foraminal
stenosis without significant canal or right foraminal stenosis.

C7-T1: No significant disc protrusion, foraminal stenosis, or canal
stenosis.
IMPRESSION: 1. Mild to moderate left foraminal stenosis C5-C6 and mild left
foraminal stenosis at C4-C5.
2. Mild canal stenosis at C3-C4, C4-C5, and C5-C6.

## 2021-12-18 MED ORDER — STROKE: EARLY STAGES OF RECOVERY BOOK
Freq: Once | Status: AC
  Filled 2021-12-18: qty 1

## 2021-12-18 MED ORDER — ACETAMINOPHEN 325 MG PO TABS
650.0000 mg | ORAL_TABLET | ORAL | Status: DC | PRN
  Administered 2021-12-19 (×2): 650 mg via ORAL
  Filled 2021-12-18 (×2): qty 2

## 2021-12-18 MED ORDER — ACETAMINOPHEN 650 MG RE SUPP: 650.0000 mg | RECTAL | Status: DC | PRN

## 2021-12-18 MED ORDER — ACETAMINOPHEN 160 MG/5ML PO SOLN: 650.0000 mg | ORAL | Status: DC | PRN

## 2021-12-18 MED ORDER — ENOXAPARIN SODIUM 40 MG/0.4ML IJ SOSY
40.0000 mg | PREFILLED_SYRINGE | INTRAMUSCULAR | Status: DC
  Administered 2021-12-18 – 2021-12-19 (×2): 40 mg via SUBCUTANEOUS
  Filled 2021-12-18 (×2): qty 0.4

## 2021-12-18 MED ORDER — POTASSIUM CHLORIDE CRYS ER 20 MEQ PO TBCR
40.0000 meq | EXTENDED_RELEASE_TABLET | Freq: Once | ORAL | Status: AC
  Administered 2021-12-18: 40 meq via ORAL
  Filled 2021-12-18: qty 2

## 2021-12-18 MED ORDER — FAMOTIDINE 20 MG PO TABS
40.0000 mg | ORAL_TABLET | Freq: Every day | ORAL | Status: DC
  Administered 2021-12-18 – 2021-12-20 (×3): 40 mg via ORAL
  Filled 2021-12-18 (×3): qty 2

## 2021-12-18 MED ORDER — LORAZEPAM 1 MG PO TABS
1.0000 mg | ORAL_TABLET | Freq: Every day | ORAL | Status: DC
  Administered 2021-12-18 – 2021-12-19 (×2): 1 mg via ORAL
  Filled 2021-12-18 (×2): qty 1

## 2021-12-18 MED ORDER — DONEPEZIL HCL 10 MG PO TABS
5.0000 mg | ORAL_TABLET | Freq: Every day | ORAL | Status: DC
  Administered 2021-12-18 – 2021-12-19 (×2): 5 mg via ORAL
  Filled 2021-12-18 (×3): qty 1

## 2021-12-18 MED ORDER — SENNOSIDES-DOCUSATE SODIUM 8.6-50 MG PO TABS: 1.0000 | ORAL_TABLET | Freq: Every evening | ORAL | Status: DC | PRN

## 2021-12-18 MED ORDER — ACETAMINOPHEN 500 MG PO TABS
1000.0000 mg | ORAL_TABLET | Freq: Once | ORAL | Status: AC
  Administered 2021-12-18: 1000 mg via ORAL
  Filled 2021-12-18: qty 2

## 2021-12-18 NOTE — ED Provider Notes (Signed)
Signout from [employer] at shift change. Briefly, patient presents for right upper and lower extremity symptoms.  Patient has a history of hypertension and diabetes.  PCP is in [place].  She has had 2 days of right upper extremity weakness, difficulty grasping objects.  She has had several additional days of right lower extremity pain and weakness.  Per daughter at bedside, patient has been very "wobbly" on her feet.  Transported to the ED by EMS today.  No code stroke due to being out of the window.    Plan: Patient awaiting MRI of the brain and cervical spine to evaluate for possibility of acute stroke.   4:47 PM Reassessment performed. Patient appears comfortable.    Labs and imaging personally reviewed and interpreted including: CBC unremarkable; CMP mild hypokalemia at 3.1, glucose 179; UA without signs of infection; venous blood gas otherwise noncontributory; respiratory panel negative; UDS negative; ethanol negative.  MRI reviewed, agree LEFT basal ganglia stroke.   Reviewed additional pertinent lab work and imaging with patient and daughter at bedside including: MRI findings   Most current vital signs reviewed and are as follows: BP (!) 169/80 (BP Location: Right Arm)   Pulse 95   Temp 98.9 F (37.2 C) (Oral)   Resp 20   SpO2 100%    Plan: Admission to hospital for stroke evaluation.  This is a new problem for the patient.  I consulted with neuro hospitalist, Dr. Thomasena Edis, by telephone.  Discussed findings to this point.  He will consult on the patient.  Consulted with IMTS service as pt is unassigned.  They will see patient for admission.     Renne Crigler, PA-C 12/18/21 1654    Glynn Octave, MD 12/18/21 [phone]

## 2021-12-18 NOTE — ED Notes (Signed)
ED TO INPATIENT HANDOFF REPORT  ED Nurse Name and Phone #: Lysbeth Galas [phone]  S Name/Age/Gender Karla Vincent 81 y.o. female Room/Bed: H018C/H018C  Code Status   Code Status: Full Code  Home/SNF/Other Home Patient oriented to: self, place, time, and situation Is this baseline? 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 Pt also complains of right leg pain as well. Pt ambulatory with assistance and AOx4. EMS vitals: 180/74, 70HR, 99%RA, 284CBG 963ml NS given en route   Allergies Allergies  Allergen Reactions   Sulfa Antibiotics Itching    Level of Care/Admitting Diagnosis ED Disposition     ED Disposition  Admit   Condition  --   [place]: [place] [[id number]]  Level of Care: Progressive [102]  Admit to Progressive based on following criteria: NEUROLOGICAL AND NEUROSURGICAL complex patients with significant risk of instability, who do not meet ICU criteria, yet require close observation or frequent assessment (< / = every 2 - 4 hours) with medical / nursing intervention.  May place patient in observation at [facility] or [place] if equivalent level of care is available:: No  Covid Evaluation: Asymptomatic - no recent exposure (last 10 days) testing not required  Diagnosis: Infarction of left basal ganglia [facility]) [id number]  Admitting Physician: Aldine Contes [id number]  Attending Physician: Aldine Contes [phone]          B Medical/Surgery History Past Medical History:  Diagnosis Date   Diabetes mellitus without complication ([place])    GERD (gastroesophageal reflux disease)    Hypertension    No past surgical history on file.   A IV Location/Drains/Wounds Patient Lines/Drains/Airways Status     Active  Line/Drains/Airways     Name Placement date Placement time Site Days   Peripheral IV 12/18/21 18 G Left Antecubital 12/18/21  1229  Antecubital  less than 1            Intake/Output Last 24 hours No intake or output data in the 24 hours ending 12/18/21 1834  Labs/Imaging Results for orders placed or performed during the hospital encounter of 12/18/21 (from the past 48 hour(s))  Ethanol     Status: None   Collection Time: 12/18/21 12:46 PM  Result Value Ref Range   Alcohol, Ethyl (B) <10 <10 mg/dL    Comment: (NOTE) Lowest detectable limit for serum alcohol is 10 mg/dL.  For medical purposes only. Performed at [facility], [place] [address] [place], [place] [address]   Resp Panel by RT-PCR (Flu A&B, Covid) Anterior Nasal Swab     Status: None   Collection Time: 12/18/21  1:21 PM   Specimen: Anterior Nasal Swab  Result Value Ref Range   SARS Coronavirus 2 by RT PCR NEGATIVE NEGATIVE    Comment: (NOTE) SARS-CoV-2 target nucleic acids are NOT DETECTED.  The SARS-CoV-2 RNA is generally detectable in upper respiratory specimens during the acute phase of infection. The lowest concentration of SARS-CoV-2 viral copies this assay can detect is 138 copies/mL. A negative result does not preclude SARS-Cov-2 infection and should not be used as the sole basis for treatment or other patient management decisions. A negative result may occur with  improper specimen collection/handling, submission of specimen other than nasopharyngeal swab, presence of viral mutation(s) within  the areas targeted by this assay, and inadequate number of viral copies(<138 copies/mL). A negative result must be combined with clinical observations, patient history, and epidemiological information. The expected result is Negative.  Fact Sheet for Patients:  EntrepreneurPulse.com.au  Fact Sheet for Healthcare Providers:  IncredibleEmployment.be  This test is no t yet  approved or cleared by the [place] FDA and  has been authorized for detection and/or diagnosis of SARS-CoV-2 by FDA under an Emergency Use Authorization (EUA). This EUA will remain  in effect (meaning this test can be used) for the duration of the COVID-19 declaration under Section 564(b)(1) of the Act, 21 U.S.C.section 360bbb-3(b)(1), unless the authorization is terminated  or revoked sooner.       Influenza A by PCR NEGATIVE NEGATIVE   Influenza B by PCR NEGATIVE NEGATIVE    Comment: (NOTE) The Xpert Xpress SARS-CoV-2/FLU/RSV plus assay is intended as an aid in the diagnosis of influenza from Nasopharyngeal swab specimens and should not be used as a sole basis for treatment. Nasal washings and aspirates are unacceptable for Xpert Xpress SARS-CoV-2/FLU/RSV testing.  Fact Sheet for Patients: EntrepreneurPulse.com.au  Fact Sheet for Healthcare Providers: IncredibleEmployment.be  This test is not yet approved or cleared by the [place] FDA and has been authorized for detection and/or diagnosis of SARS-CoV-2 by FDA under an Emergency Use Authorization (EUA). This EUA will remain in effect (meaning this test can be used) for the duration of the COVID-19 declaration under Section 564(b)(1) of the Act, 21 U.S.C. section 360bbb-3(b)(1), unless the authorization is terminated or revoked.  Performed at [facility], [place] [address] [place], [place] [address]   Protime-INR     Status: None   Collection Time: 12/18/21  1:21 PM  Result Value Ref Range   Prothrombin Time 13.7 11.4 - 15.2 seconds   INR 1.1 0.8 - 1.2    Comment: (NOTE) INR goal varies based on device and disease states. Performed at [facility], [place] [address] [place], [place] [address]   APTT     Status: None   Collection Time: 12/18/21  1:21 PM  Result Value Ref Range   aPTT 29 24 - 36 seconds    Comment: Performed at [place] [address], [place], [place] [address]  CBC     Status: None   Collection Time: 12/18/21  1:21 PM  Result Value Ref Range   WBC 5.8 4.0 - 10.5 K/uL   RBC 4.70 3.87 - 5.11 MIL/uL   Hemoglobin 13.8 12.0 - 15.0 g/dL   HCT 40.5 36.0 - 46.0 %   MCV 86.2 80.0 - 100.0 fL   MCH 29.4 26.0 - 34.0 pg   MCHC 34.1 30.0 - 36.0 g/dL   RDW 13.3 11.5 - 15.5 %   Platelets 156 150 - 400 K/uL   nRBC 0.0 0.0 - 0.2 %    Comment: Performed at [facility], [place] [address], [place], [place] [address]  Differential     Status: None   Collection Time: 12/18/21  1:21 PM  Result Value Ref Range   Neutrophils Relative % 66 %   Neutro Abs 3.9 1.7 - 7.7 K/uL   Lymphocytes Relative 20 %   Lymphs Abs 1.2 0.7 - 4.0 K/uL   Monocytes Relative 12 %   Monocytes Absolute 0.7 0.1 - 1.0 K/uL   Eosinophils Relative 1 %   Eosinophils Absolute 0.1 0.0 - 0.5 K/uL   Basophils Relative 1 %  Basophils Absolute 0.0 0.0 - 0.1 K/uL   Immature Granulocytes 0 %   Abs Immature Granulocytes 0.01 0.00 - 0.07 K/uL    Comment: Performed at [facility], [place] [address], [place], [place] [address]  Comprehensive metabolic panel     Status: Abnormal   Collection Time: 12/18/21  1:21 PM  Result Value Ref Range   Sodium 139 135 - 145 mmol/L   Potassium 3.1 (L) 3.5 - 5.1 mmol/L   Chloride 105 98 - 111 mmol/L   CO2 27 22 - 32 mmol/L   Glucose, Bld 179 (H) 70 - 99 mg/dL    Comment: Glucose reference range applies only to samples taken after fasting for at least 8 hours.   BUN 15 8 - 23 mg/dL   Creatinine, Ser 0.99 0.44 - 1.00 mg/dL   Calcium 8.8 (L) 8.9 - 10.3 mg/dL   Total Protein 6.6 6.5 - 8.1 g/dL   Albumin 3.4 (L) 3.5 - 5.0 g/dL   AST 24 15 - 41 U/L   ALT 13 0 - 44 U/L   Alkaline Phosphatase 66 38 - 126 U/L   Total Bilirubin 0.9 0.3 - 1.2 mg/dL   GFR, Estimated 57 (L) >60 mL/min    Comment: (NOTE) Calculated using the CKD-EPI Creatinine Equation (2021)    Anion gap 7 5 - 15    Comment: Performed at [facility] [address], [place],  [address]  I-Stat venous blood gas, ED     Status: Abnormal   Collection Time: 12/18/21  1:33 PM  Result Value Ref Range   pH, Ven 7.462 (H) 7.25 - 7.43   pCO2, Ven 39.2 (L) 44 - 60 mmHg   pO2, Ven 34 32 - 45 mmHg   Bicarbonate 28.0 20.0 - 28.0 mmol/L   TCO2 29 22 - 32 mmol/L   O2 Saturation 68 %   Acid-Base Excess 4.0 (H) 0.0 - 2.0 mmol/L   Sodium 141 135 - 145 mmol/L   Potassium 3.1 (L) 3.5 - 5.1 mmol/L   Calcium, Ion 1.07 (L) 1.15 - 1.40 mmol/L   HCT 40.0 36.0 - 46.0 %   Hemoglobin 13.6 12.0 - 15.0 g/dL   Sample type VENOUS    Comment NOTIFIED PHYSICIAN   Urine rapid drug screen (hosp performed)     Status: None   Collection Time: 12/18/21  2:00 PM  Result Value Ref Range   Opiates NONE DETECTED NONE DETECTED   Cocaine NONE DETECTED NONE DETECTED   Benzodiazepines NONE DETECTED NONE DETECTED   Amphetamines NONE DETECTED NONE DETECTED   Tetrahydrocannabinol NONE DETECTED NONE DETECTED   Barbiturates NONE DETECTED NONE DETECTED    Comment: (NOTE) DRUG SCREEN FOR MEDICAL PURPOSES ONLY.  IF CONFIRMATION IS NEEDED FOR ANY PURPOSE, NOTIFY LAB WITHIN 5 DAYS.  LOWEST DETECTABLE LIMITS FOR URINE DRUG SCREEN Drug Class                     Cutoff (ng/mL) Amphetamine and metabolites    1000 Barbiturate and metabolites    200 Benzodiazepine                 [id number] Tricyclics and metabolites     300 Opiates and metabolites        300 Cocaine and metabolites        300 THC  50 Performed at [facility], [place] [address], [place], [place] [address]   Urinalysis, Routine w reflex microscopic Urine, Clean Catch     Status: Abnormal   Collection Time: 12/18/21  2:00 PM  Result Value Ref Range   Color, Urine STRAW (A) YELLOW   APPearance CLEAR CLEAR   Specific Gravity, Urine 1.008 1.005 - 1.030   pH 7.0 5.0 - 8.0   Glucose, UA 150 (A) NEGATIVE mg/dL   Hgb urine dipstick NEGATIVE NEGATIVE   Bilirubin Urine NEGATIVE NEGATIVE    Ketones, ur NEGATIVE NEGATIVE mg/dL   Protein, ur NEGATIVE NEGATIVE mg/dL   Nitrite NEGATIVE NEGATIVE   Leukocytes,Ua NEGATIVE NEGATIVE    Comment: Performed at [facility] [address], [place], [place] [address]  I-stat chem 8, ED     Status: Abnormal   Collection Time: 12/18/21  3:03 PM  Result Value Ref Range   Sodium 141 135 - 145 mmol/L   Potassium 3.1 (L) 3.5 - 5.1 mmol/L   Chloride 104 98 - 111 mmol/L   BUN 17 8 - 23 mg/dL   Creatinine, Ser 0.90 0.44 - 1.00 mg/dL   Glucose, Bld 171 (H) 70 - 99 mg/dL    Comment: Glucose reference range applies only to samples taken after fasting for at least 8 hours.   Calcium, Ion 1.05 (L) 1.15 - 1.40 mmol/L   TCO2 28 22 - 32 mmol/L   Hemoglobin 14.6 12.0 - 15.0 g/dL   HCT 43.0 36.0 - 46.0 %   CT HEAD WO CONTRAST  Result Date: 12/18/2021 CLINICAL DATA:  Neuro deficit, acute stroke suspected EXAM: CT HEAD WITHOUT CONTRAST TECHNIQUE: Contiguous axial images were obtained from the base of the skull through the vertex without intravenous contrast. RADIATION DOSE REDUCTION: This exam was performed according to the departmental dose-optimization program which includes automated exposure control, adjustment of the mA and/or kV according to patient size and/or use of iterative reconstruction technique. COMPARISON:  09/29/2021 FINDINGS: Brain: No evidence of acute infarction, hemorrhage, hydrocephalus, extra-axial collection or mass lesion/mass effect. Vascular: No hyperdense vessel or unexpected calcification. Skull: Normal. Negative for fracture or focal lesion. Sinuses/Orbits: No acute finding. Other: None. IMPRESSION: No acute intracranial pathology. Electronically Signed   By: Delanna Ahmadi M.D.   On: 12/18/2021 14:04   MR BRAIN WO CONTRAST  Result Date: 12/18/2021 CLINICAL DATA:  Neuro deficit, acute, stroke suspected EXAM: MRI HEAD WITHOUT CONTRAST TECHNIQUE: Multiplanar, multiecho pulse sequences of the brain and surrounding structures were  obtained without intravenous contrast. COMPARISON:  CT head from the same day. FINDINGS: Brain: Multiple small acute perforator infarcts in the left basal ganglia. Mild edema without mass effect. Additional mild scattered T2/FLAIR hyperintensities in the white matter, compatible with chronic microvascular ischemic disease as mild for age. Cerebral atrophy. No mass lesion, hydrocephalus, acute hemorrhage, or extra-axial fluid collection. Vascular: Major arterial flow voids are maintained skull base. Skull and upper cervical spine: Normal marrow signal. Sinuses/Orbits: Mild paranasal sinus mucosal thickening. No acute orbital findings. Other: No mastoid effusions. IMPRESSION: 1. Multiple small acute perforator infarcts in the left basal ganglia. 2.  Cerebral atrophy (ICD10-G31.9). Electronically Signed   By: Margaretha Sheffield M.D.   On: 12/18/2021 15:51   MR Cervical Spine Wo Contrast  Result Date: 12/18/2021 CLINICAL DATA:  Cervical radiculopathy, no red flags EXAM: MRI CERVICAL SPINE WITHOUT CONTRAST TECHNIQUE: Multiplanar, multisequence MR imaging of the cervical spine was performed. No intravenous contrast was administered. COMPARISON:  None Available. FINDINGS:  Alignment: Slight anterolisthesis of C4 on C5 and C5 on C6. Vertebrae: Vertebral body heights are maintained. No focal marrow edema to suggest acute fracture discitis/osteomyelitis. No suspicious bone lesions. Cord: Normal cord signal. Posterior Fossa, vertebral arteries, paraspinal tissues: Visualized vertebral artery flow voids are maintained. Unremarkable appearance of the visualized posterior fossa. Disc levels: C2-C3: Mild posterior disc osteophyte complex without significant canal or foraminal stenosis. C3-C4: Posterior disc osteophyte complex thigh for hand lesion which flattens the ventral cord with mild canal stenosis. No significant foraminal stenosis. C4-C5: Posterior disc osteophyte complex. Left facet uncovertebral hypertrophy. Mild canal  and left foraminal stenosis. C5-C6: Posterior disc osteophyte complex with left greater than right facet and uncovertebral hypertrophy. Resulting mild-to-moderate left foraminal stenosis with mild canal stenosis. C6-C7: Posterior disc osteophyte complex. Left greater than right facet and uncovertebral hypertrophy. Resulting mild left foraminal stenosis without significant canal or right foraminal stenosis. C7-T1: No significant disc protrusion, foraminal stenosis, or canal stenosis. IMPRESSION: 1. 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 2. Mild canal stenosis at C3-C4, C4-C5, and C5-C6. Electronically Signed   By: Margaretha Sheffield M.D.   On: 12/18/2021 16:24    Pending Labs Unresulted Labs (From admission, onward)     Start     Ordered   12/19/21 0500  Lipid panel  (Labs)  Tomorrow morning,   R       Comments: Fasting    12/18/21 1747   12/19/21 0500  CBC  Tomorrow morning,   R        12/18/21 1816   12/19/21 [id number]  Basic metabolic panel  Tomorrow morning,   R        12/18/21 1816   12/18/21 1748  TSH  Once,   R        12/18/21 1747   12/18/21 1742  Hemoglobin A1c  (Labs)  Once,   R       Comments: To assess prior glycemic control    12/18/21 1747            Vitals/Pain Today's Vitals   12/18/21 1224 12/18/21 1227 12/18/21 1641 12/18/21 1714  BP: (!) 169/80   (!) 180/80  Pulse: 95   90  Resp: 20   18  Temp: 98.9 F (37.2 C)     TempSrc: Oral     SpO2: 100%   100%  PainSc:  4  3      Isolation Precautions No active isolations  Medications Medications   stroke: early stages of recovery book (has no administration in time range)  senna-docusate (Senokot-S) tablet 1 tablet (has no administration in time range)  enoxaparin (LOVENOX) injection 40 mg (has no administration in time range)  acetaminophen (TYLENOL) tablet 650 mg (has no administration in time range)    Or  acetaminophen (TYLENOL) 160 MG/5ML solution 650 mg (has  no administration in time range)    Or  acetaminophen (TYLENOL) suppository 650 mg (has no administration in time range)  donepezil (ARICEPT) tablet 5 mg (has no administration in time range)  LORazepam (ATIVAN) tablet 1 mg (has no administration in time range)  acetaminophen (TYLENOL) tablet 1,000 mg (1,000 mg Oral Given 12/18/21 1459)  potassium chloride SA (KLOR-CON M) CR tablet 40 mEq (40 mEq Oral Given 12/18/21 1641)    Mobility walks with person assist Low fall risk   Focused Assessments Neuro Assessment Handoff:  Swallow screen pass? Yes    NIH Stroke Scale ( + Modified Stroke  Scale Criteria)  LOC Questions (1b. )   +: Answers both questions correctly LOC Commands (1c. )   + : Performs both tasks correctly Best Gaze (2. )  +: Normal Visual (3. )  +: No visual loss Motor Arm, Left (5a. )   +: No drift Motor Arm, Right (5b. )   +: No drift Motor Leg, Left (6a. )   +: No drift Motor Leg, Right (6b. )   +: No drift Sensory (8. )   +: Normal, no sensory loss Best Language (9. )   +: No aphasia Extinction/Inattention (11.)   +: No Abnormality Modified SS Total  +: 0     Neuro Assessment: Within Defined Limits Neuro Checks:      Last Documented NIHSS Modified Score: 0 (12/18/21 1808) Has TPA been given? No If patient is a Neuro Trauma and patient is going to OR before floor call report to [place] nurse: [phone] or [phone]   R Recommendations: See Admitting Provider Note  Report given to:   Additional Notes:

## 2021-12-18 NOTE — ED Triage Notes (Signed)
Pt bib [facility] from home with right arm weakness x several weeks, started with back pain and originally thought that it was nerve pain. Pt also complains of right leg pain as well. Pt ambulatory with assistance and AOx4. EMS vitals: 180/74, 70HR, 99%RA, 284CBG NS given en route

## 2021-12-18 NOTE — H&P (Addendum)
Date: 12/18/2021               Patient Name:  Karla Vincent MRN: [id number]  DOB: [birth year] Age / Sex: 81 y.o., female   PCP: Gennette Pac, NP         Medical Service: Internal Medicine Teaching Service         Attending Physician: Dr. Dareen Piano     First Contact: Lajean Manes, MD Pager: [phone]  Second Contact: Linwood Dibbles, MD Pager: PA [phone]       After Hours (After 5p/  First Contact Pager: [phone]  weekends / holidays): Second Contact Pager: [phone]    Chief Complaint: right arm weakness   History of Present Illness:  Karla Vincent is a 81 y.o. female with a pertinent PMH of HTN, T2DM, and HLD presenting to the [facility] ED for complaints of right arm weakness for 2 days. She is accompanied by her daughter, who assists with collateral information. She reports that she was in her usual state of health when she noticed right arm weakness and unsteady gait x 2 days ago. Denies any sensory changes. Denies headaches, confusion or vision changes. No new right leg complaints at this time. She initially thought these symptoms were related to a fall from 4 months ago when she fell on her tailbone, as she has been experiencing residual leg pain bilaterally since the fall. She reports going to the UC two day where she got an X-ray of the arm, which revealed no acute problems. Due to ongoing symptoms, she was brought to the ED today by her daughter. Pt denies any recent falls or head trauma. No new medication changes. Reports excellent medication compliance to current regimen. She does report that she was previously tried several different statin medications, but eventually discontinued d/t ongoing "joint pain".    Denies any fevers, chills, nausea, vomiting, diarrhea, constipation, chest pain, SHOB, dark stools, bloody stools, or urinary symptoms.   Initial ED workup reassuring lab work. CT brain negative. MRI revealing multiple small acute perforator infarcts in the left basal ganglia. MR  cervical spine with mild to moderate left foraminal stenosis C5-C6 and mild left foraminal stenosis at C4-C5. Mild canal stenosis at C3-C4, C4-C5, and C5-C6. CBC, CMP, UA, and urine tox were re-assuring. Neurology/Dr Theda Sers was consulted. Had already received full dose ASA by EMS en route to ED. IMTS consulted for admission.   Meds:  Losartan-HCTZ 100-25 mg daily Donepezil 5 mg daily Meloxicam 7.5 mg twice daily Amlodipine 5 mg daily Lorazepam 1 mg as needed at bedtime Famotidine 40 mg daily Glipizide 5 mg daily  Allergies: Allergies as of 12/18/2021 - never reviewed  Allergen Reaction Noted   Sulfa antibiotics Itching 09/28/2021   Past Medical History:  Diagnosis Date   Diabetes mellitus without complication ([place])    GERD (gastroesophageal reflux disease)    Hypertension     Family History:  No known pertinent family history   Social History:   Lives in a city outside of [place]. Her son stays with her. She has a daughter who live close by as well.  Employment- retired  Tobacco- Denies use EtOH- Denies use Illicit drug use- denies use  IADLs/ADLs- can person independently at baseline   Review of Systems: A complete ROS was negative except as per HPI.    Physical Exam: Blood pressure (!) 180/80, pulse 90, temperature 98.9 F (37.2 C), temperature source Oral, resp. rate 18, SpO2 100 %. Constitutional: alert, well-appearing, in NAD  HENT: normocephalic, atraumatic, mucous membranes moist Eyes: conjunctiva non-erythematous, EOMI Cardiovascular: RRR, systolic murmur, non-edematous bilateral LE Pulmonary/Chest: normal work of breathing on room air, LCTAB Abdominal: soft, non-tender to palpation, non-distended MSK: slightly reduced bulk and tone Neurological: A&O x 3 and follows commands. No facial droop. Sensation intact. Finger to nose/ cerebellar exam reassuring. Good hand grip, L>R. 5/5 strength on left side. 4/5 strength on right side. Gait testing not performed.   Skin: warm and dry   Assessment & Plan by Problem: Principal Problem:   Infarction of left basal ganglia (HCC)  Karla Vincent is a 82 y.o. female with a pertinent PMH of HTN, HLD, and T2DM admitted for acute left basal ganglia infarction.   Acute left basal ganglia infarction  Presenting with right sided motor deficits with unsteady gait x 2 days. MRI revealing multiple small acute perforator infarcts in the left basal ganglia. Risk factors include T2DM, HTN, and HLD not on a statin. Urine toxicology negative. Recent outpatient echo on 11/27/21 was reassuring, with EF 65%.  - Neurology consulted by EDP; follow up on recommendations - Passed swallow screen, diet advanced  - 2D echo ordered  - Cardiac monitoring  - A1c, Lipid panel, TSH ordered  - AM cbc and bmp  - Holding home Losartan-HCTZ 100-25 mg daily and Amlodipine 5 mg daily for permissive HTN  - PT/OT consulted  - Fall precautions   HTN  Holding home Losartan-HCTZ  and Amlodipine to allow for permissive HTN as outlined above    HLD LDL 125 on 10/27/21. Reports statin intolerance d/t "joint pain".  - Lipid panel ordered   T2DM A1c 6.1 on 10/13/21. Well controlled. She is on Glipizide 5 mg daily.  - A1c pending    Anxiety  On Lorazepam 1 mg as needed at bedtime. Reports daily use.  - Resumed home Lorazepam 1 mg daily prn     Best Practice: Diet: NPO IVF: None,None VTE: Enoxaparin Code: Full   Lajean Manes, MD  Internal Medicine Resident, PGY-1 Pager: [phone] 5:57 PM, 12/18/2021

## 2021-12-18 NOTE — ED Provider Notes (Signed)
[facility] EMERGENCY DEPARTMENT Provider Note   CSN: [id number] Arrival date & time: 12/18/21  1213     History  Chief Complaint  Patient presents with   Weakness    Karla Vincent is a 81 y.o. female.  The history is provided by the patient and medical records. No language interpreter was used.  Weakness  81 year old female significant history of diabetes, hypertension, brought here via EMS from home for evaluation of weakness She reported for nearly a week she has had weakness and mild pain to right lower extremity for the past 3 to 4 days she also has weakness to her right upper extremity.  She reports having to hold on to things to ambulate.  She is having trouble grabbing onto objects with her right hand.  The symptoms are new and are concerning for her.  She endorsed occasional pain to the left side of her neck.  She does not endorse any fever, headache, confusion, slurring of speech, chest pain, trouble breathing, abdominal pain, back pain.  She is right-hand dominant.  No prior history of stroke.  No specific treatment tried.  Home Medications Prior to Admission medications   Medication Sig Start Date End Date Taking? Authorizing Provider  losartan-hydrochlorothiazide (HYZAAR) 100-25 MG tablet Take 1 tablet by mouth daily. 09/29/21   Mesner, Corene Cornea, MD      Allergies    Sulfa antibiotics    Review of Systems   Review of Systems  Neurological:  Positive for weakness.  All other systems reviewed and are negative.  Physical Exam Updated Vital Signs BP (!) 169/80 (BP Location: Right Arm)   Pulse 95   Temp 98.9 F (37.2 C) (Oral)   Resp 20   SpO2 100%  Physical Exam Vitals and nursing note reviewed.  Constitutional:      General: She is not in acute distress.    Appearance: She is well-developed.  HENT:     Head: Atraumatic.  Eyes:     Conjunctiva/sclera: Conjunctivae normal.  Cardiovascular:     Rate and Rhythm: Normal rate and regular rhythm.      Pulses: Normal pulses.     Heart sounds: Murmur heard.  Pulmonary:     Effort: Pulmonary effort is normal.  Abdominal:     Palpations: Abdomen is soft.     Tenderness: There is no abdominal tenderness.  Musculoskeletal:     Cervical back: Normal range of motion and neck supple. No tenderness.  Skin:    Findings: No rash.  Neurological:     Mental Status: She is alert and oriented to person, place, and time.     Comments: Neurologic exam:  Speech clear, pupils equal round reactive to light, extraocular movements intact  Normal peripheral visual fields Cranial nerves III through XII normal including no facial droop Follows commands, able to move all 4 extremities however decreased strength to right upper and lower extremity compared to left upper and lower extremity.  4 out of 5 strength to right upper arm and right lower leg.  Sensation diminished to right upper lower extremity compared to left.  Coordination not tested.  Right arm drift noted.  Gait unsteady    Psychiatric:        Mood and Affect: Mood normal.    ED Results / Procedures / Treatments   Labs (all labs ordered are listed, but only abnormal results are displayed) Labs Reviewed  COMPREHENSIVE METABOLIC PANEL - Abnormal; Notable for the following components:  Result Value   Potassium 3.1 (*)    Glucose, Bld 179 (*)    Calcium 8.8 (*)    Albumin 3.4 (*)    GFR, Estimated 57 (*)    All other components within normal limits  URINALYSIS, ROUTINE W REFLEX MICROSCOPIC - Abnormal; Notable for the following components:   Color, Urine STRAW (*)    Glucose, UA 150 (*)    All other components within normal limits  I-STAT CHEM 8, ED - Abnormal; Notable for the following components:   Potassium 3.1 (*)    Glucose, Bld 171 (*)    Calcium, Ion 1.05 (*)    All other components within normal limits  I-STAT VENOUS BLOOD GAS, ED - Abnormal; Notable for the following components:   pH, Ven 7.462 (*)    pCO2, Ven 39.2 (*)     Acid-Base Excess 4.0 (*)    Potassium 3.1 (*)    Calcium, Ion 1.07 (*)    All other components within normal limits  RESP PANEL BY RT-PCR (FLU A&B, COVID) ARPGX2  ETHANOL  PROTIME-INR  APTT  CBC  DIFFERENTIAL  RAPID URINE DRUG SCREEN, HOSP PERFORMED    EKG None  Radiology CT HEAD WO CONTRAST  Result Date: 12/18/2021 CLINICAL DATA:  Neuro deficit, acute stroke suspected EXAM: CT HEAD WITHOUT CONTRAST TECHNIQUE: Contiguous axial images were obtained from the base of the skull through the vertex without intravenous contrast. RADIATION DOSE REDUCTION: This exam was performed according to the departmental dose-optimization program which includes automated exposure control, adjustment of the mA and/or kV according to patient size and/or use of iterative reconstruction technique. COMPARISON:  09/29/2021 FINDINGS: Brain: No evidence of acute infarction, hemorrhage, hydrocephalus, extra-axial collection or mass lesion/mass effect. Vascular: No hyperdense vessel or unexpected calcification. Skull: Normal. Negative for fracture or focal lesion. Sinuses/Orbits: No acute finding. Other: None. IMPRESSION: No acute intracranial pathology. Electronically Signed   By: Delanna Ahmadi M.D.   On: 12/18/2021 14:04    Procedures Procedures    Medications Ordered in ED Medications  potassium chloride SA (KLOR-CON M) CR tablet 40 mEq (has no administration in time range)  acetaminophen (TYLENOL) tablet 1,000 mg (1,000 mg Oral Given 12/18/21 1459)    ED Course/ Medical Decision Making/ A&P                           Medical Decision Making Amount and/or Complexity of Data Reviewed Labs: ordered. Radiology: ordered.  Risk OTC drugs. Prescription drug management.   BP (!) 169/80 (BP Location: Right Arm)   Pulse 95   Temp 98.9 F (37.2 C) (Oral)   Resp 20   SpO2 100%   12:55 PM This is an 81 year old female with history of hypertension and diabetes presenting with complaint of weakness and  stinging sensation involving her right upper and lower extremity for nearly a week.  She endorses mild left-sided neck pain as well.  No headache.  On exam patient is mentating appropriately and is alert and oriented x4.  She does have subjective decrease sensation to her right upper and lower extremity compared to left.  She does have mild right arm drift, decreased grip strength on her right hand and 4-5 strength to her right lower extremity.  Symptoms concerning for stroke however she is outside the window for TNK.  Work-up initiated  3:18 PM Labs, EKG, and imaging independently viewed and treated by me and agree with radiologist interpretation.  Patient does have  evidence of hypokalemia with potassium of 3.1, supplementation given.  Urinalysis obtained without signs of urinary tract infection, normal WBC, normal H&H, head CT scan without any acute intracranial pathology.  Patient signed out to oncoming team who follow-up on brain MRI and will determine disposition.    This patient presents to the ED for concern of hemiparesis, this involves an extensive number of treatment options, and is a complaint that carries with it a high risk of complications and morbidity.  The differential diagnosis includes CVA, cervical radiculopathy, demyelinating disease, MSK pain, complicated migraine, hypoglycemia  Co morbidities that complicate the patient evaluation DM  Additional history obtained:  Additional history obtained from patient External records from outside source obtained and reviewed including family medicine  Lab Tests:  I Ordered, and personally interpreted labs.  The pertinent results include:  as above  Imaging Studies ordered:  I ordered imaging studies including head CT I independently visualized and interpreted imaging which showed no acute finding I agree with the radiologist interpretation  Cardiac Monitoring:  The patient was maintained on a cardiac monitor.  I personally  viewed and interpreted the cardiac monitored which showed an underlying rhythm of: NSR  Medicines ordered and prescription drug management:  I ordered medication including tylenol  for pain to R arm/leg Reevaluation of the patient after these medicines showed that the patient improved I have reviewed the patients home medicines and have made adjustments as needed  Test Considered: brain and cervical spine MRI; currently pending  Critical Interventions: stroke work up   Problem List / ED Course: R hemiparesis  Reevaluation:  After the interventions noted above, I reevaluated the patient and found that they have :stayed the same  Social Determinants of Health:   Dispostion:  After consideration of the diagnostic results and the patients response to treatment, I feel that the patent would benefit from signing out to oncoming team to determine disposition.  Anticipate admission and consultation with neurology         Final Clinical Impression(s) / ED Diagnoses Final diagnoses:  None    Rx / DC Orders ED Discharge Orders     None         Domenic Moras, PA-C 12/18/21 1519    Luna Fuse, MD 12/27/21 [phone]

## 2021-12-18 NOTE — Hospital Course (Addendum)
Losartan-HCTZ 100-25 mg daily Donepezil 5 mg daily Meloxicam 7.5 mg twice daily Amlodipine 5 mg daily Lorazepam 1 mg as needed at bedtime Famotidine 40 mg daily Glipizide 5 mg daily

## 2021-12-19 ENCOUNTER — Observation Stay (HOSPITAL_COMMUNITY): Payer: Medicare Other

## 2021-12-19 DIAGNOSIS — Z7984 Long term (current) use of oral hypoglycemic drugs: Secondary | ICD-10-CM | POA: Diagnosis not present

## 2021-12-19 DIAGNOSIS — E7800 Pure hypercholesterolemia, unspecified: Secondary | ICD-10-CM

## 2021-12-19 DIAGNOSIS — Z20822 Contact with and (suspected) exposure to covid-19: Secondary | ICD-10-CM | POA: Diagnosis present

## 2021-12-19 DIAGNOSIS — M4802 Spinal stenosis, cervical region: Secondary | ICD-10-CM | POA: Diagnosis present

## 2021-12-19 DIAGNOSIS — E782 Mixed hyperlipidemia: Secondary | ICD-10-CM | POA: Diagnosis not present

## 2021-12-19 DIAGNOSIS — I639 Cerebral infarction, unspecified: Secondary | ICD-10-CM

## 2021-12-19 DIAGNOSIS — Z79899 Other long term (current) drug therapy: Secondary | ICD-10-CM | POA: Diagnosis not present

## 2021-12-19 DIAGNOSIS — G8191 Hemiplegia, unspecified affecting right dominant side: Secondary | ICD-10-CM | POA: Diagnosis present

## 2021-12-19 DIAGNOSIS — E785 Hyperlipidemia, unspecified: Secondary | ICD-10-CM | POA: Diagnosis present

## 2021-12-19 DIAGNOSIS — I6389 Other cerebral infarction: Secondary | ICD-10-CM | POA: Diagnosis present

## 2021-12-19 DIAGNOSIS — E876 Hypokalemia: Secondary | ICD-10-CM | POA: Diagnosis present

## 2021-12-19 DIAGNOSIS — E119 Type 2 diabetes mellitus without complications: Secondary | ICD-10-CM | POA: Diagnosis present

## 2021-12-19 DIAGNOSIS — I10 Essential (primary) hypertension: Secondary | ICD-10-CM | POA: Diagnosis present

## 2021-12-19 DIAGNOSIS — K219 Gastro-esophageal reflux disease without esophagitis: Secondary | ICD-10-CM | POA: Diagnosis present

## 2021-12-19 DIAGNOSIS — R29810 Facial weakness: Secondary | ICD-10-CM | POA: Diagnosis present

## 2021-12-19 DIAGNOSIS — R531 Weakness: Secondary | ICD-10-CM | POA: Diagnosis present

## 2021-12-19 DIAGNOSIS — F419 Anxiety disorder, unspecified: Secondary | ICD-10-CM | POA: Diagnosis present

## 2021-12-19 LAB — ECHOCARDIOGRAM COMPLETE
Area-P 1/2: 2.95 cm2
Height: 62 in
S' Lateral: 2.4 cm
Weight: 2416.24 oz

## 2021-12-19 LAB — GLUCOSE, CAPILLARY
Glucose-Capillary: 112 mg/dL — ABNORMAL HIGH (ref 70–99)
Glucose-Capillary: 118 mg/dL — ABNORMAL HIGH (ref 70–99)
Glucose-Capillary: 193 mg/dL — ABNORMAL HIGH (ref 70–99)
Glucose-Capillary: 99 mg/dL (ref 70–99)

## 2021-12-19 LAB — LIPID PANEL
Cholesterol: 180 mg/dL (ref 0–200)
HDL: 59 mg/dL (ref >40)
LDL Cholesterol: 107 mg/dL — ABNORMAL HIGH (ref 0–99)
Total CHOL/HDL Ratio: 3.1 RATIO
Triglycerides: 68 mg/dL (ref <150)
VLDL: 14 mg/dL (ref 0–40)

## 2021-12-19 LAB — BASIC METABOLIC PANEL
Anion gap: 5 (ref 5–15)
BUN: 14 mg/dL (ref 8–23)
CO2: 26 mmol/L (ref 22–32)
Calcium: 8.5 mg/dL — ABNORMAL LOW (ref 8.9–10.3)
Chloride: 108 mmol/L (ref 98–111)
Creatinine, Ser: 0.97 mg/dL (ref 0.44–1.00)
GFR, Estimated: 59 mL/min — ABNORMAL LOW (ref >60)
Glucose, Bld: 115 mg/dL — ABNORMAL HIGH (ref 70–99)
Potassium: 3 mmol/L — ABNORMAL LOW (ref 3.5–5.1)
Sodium: 139 mmol/L (ref 135–145)

## 2021-12-19 LAB — CBC
HCT: 38.1 % (ref 36.0–46.0)
Hemoglobin: 12.8 g/dL (ref 12.0–15.0)
MCH: 28.6 pg (ref 26.0–34.0)
MCHC: 33.6 g/dL (ref 30.0–36.0)
MCV: 85.2 fL (ref 80.0–100.0)
Platelets: 146 10*3/uL — ABNORMAL LOW (ref 150–400)
RBC: 4.47 MIL/uL (ref 3.87–5.11)
RDW: 13.2 % (ref 11.5–15.5)
WBC: 7.3 10*3/uL (ref 4.0–10.5)
nRBC: 0 % (ref 0.0–0.2)

## 2021-12-19 IMAGING — CT CT ANGIO HEAD-NECK (W OR W/O PERF)
1 of 11 series · 5 of 33 positions shown · IV contrast (OMNI 350)
Comparison: At CT and brain MRI from yesterday

CLINICAL DATA: Stroke

EXAM:
CT ANGIOGRAPHY HEAD AND NECK
TECHNIQUE: Multidetector CT imaging of the head and neck was performed using
the standard protocol during bolus administration of intravenous
contrast. Multiplanar CT image reconstructions and MIPs were
obtained to evaluate the vascular anatomy. Carotid stenosis
measurements (when applicable) are obtained utilizing NASCET
criteria, using the distal internal carotid diameter as the
denominator.

[Series 11: cta neck axial · axial · 0.45mm/px · z∈[-235,-6]mm · 5 of 342 slices shown]
[im 57/342  soft-tissue]
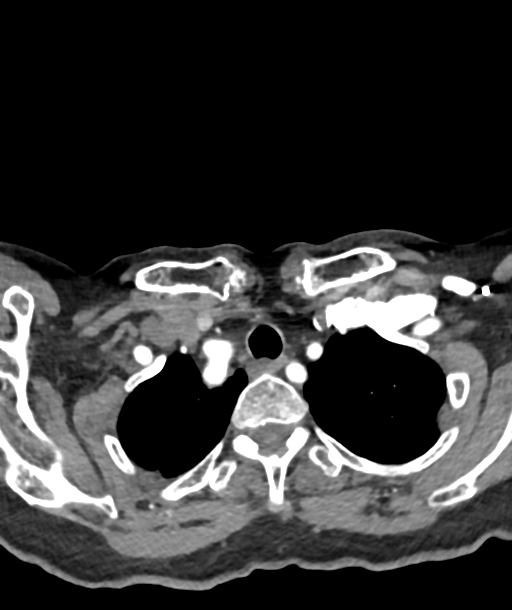
[im 114/342  bone]
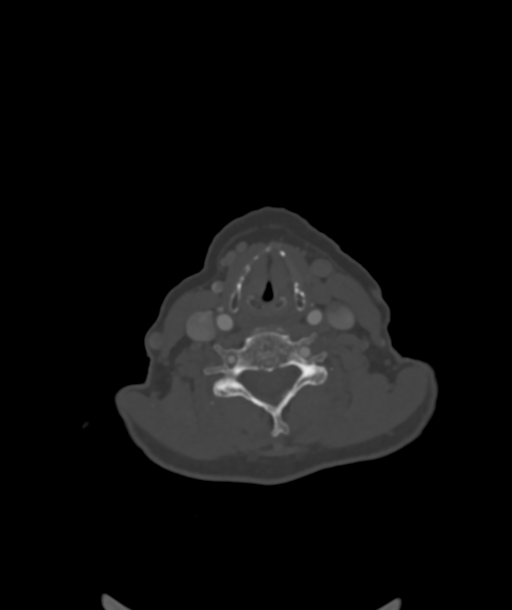
[im 171/342  soft-tissue]
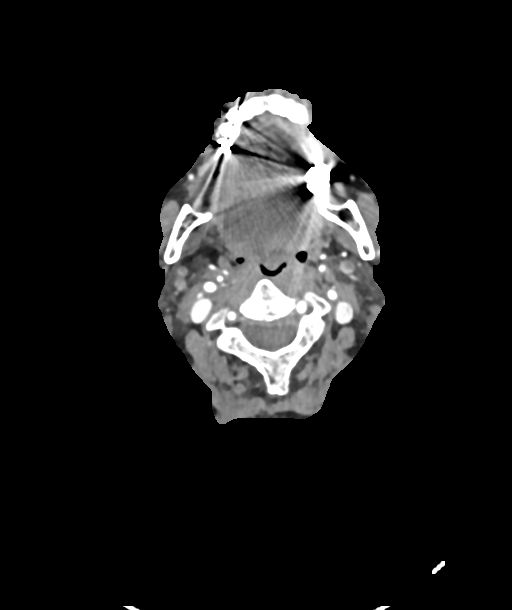
[im 228/342  bone]
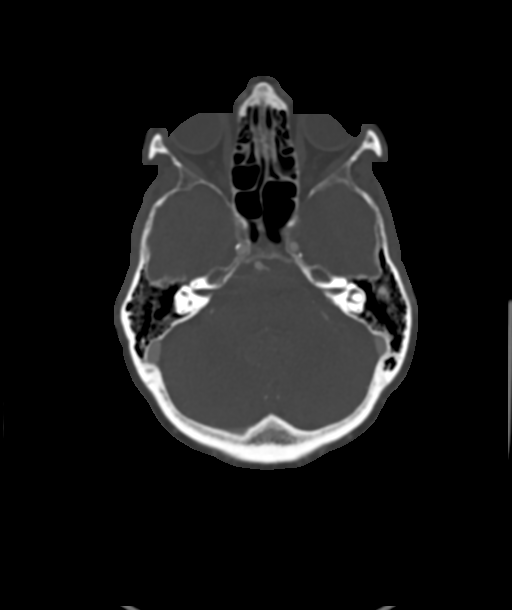
[im 285/342  soft-tissue]
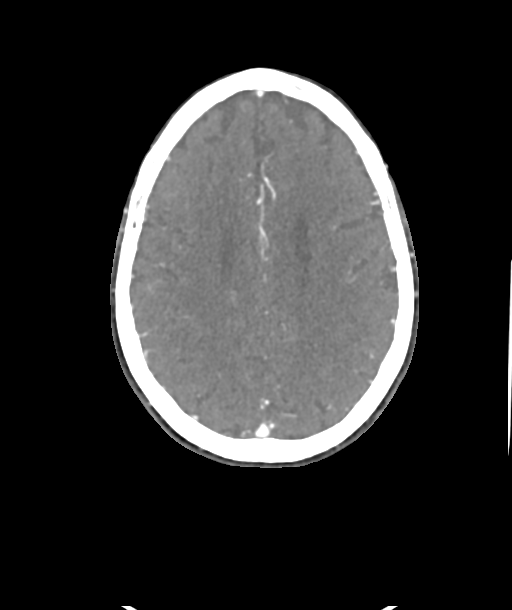

[5 of 33 positions shown; findings below may reference images not displayed]

RADIATION DOSE REDUCTION: This exam was performed according to the
departmental dose-optimization program which includes automated
exposure control, adjustment of the mA and/or kV according to
patient size and/or use of iterative reconstruction technique.

CONTRAST:  100mL OMNIPAQUE IOHEXOL 350 MG/ML SOLN
FINDINGS: CT HEAD FINDINGS

Brain: Acute infarct in the left corona radiata known from prior
MRI. No visible progression or hemorrhage. Generalized cerebral
volume loss.

Vascular: See below

Skull: Normal. Negative for fracture or focal lesion.

Sinuses: Imaged portions are clear.

Orbits: Negative

Review of the MIP images confirms the above findings

CTA NECK FINDINGS

Aortic arch: Atheromatous plaque with 3 vessel branching.

Right carotid system: Mild atheromatous plaque, especially for age.
No stenosis or ulceration.

Left carotid system: Moderate calcified plaque at the bifurcation
without flow limiting stenosis or ulceration.

Vertebral arteries: No proximal subclavian stenosis. Low-density
plaque mildly narrows the left vertebral artery at the V1 P2
junction. Negative for beading.

Skeleton: No acute finding

Other neck: No acute finding

Upper chest: Negative

Review of the MIP images confirms the above findings

CTA HEAD FINDINGS

Anterior circulation: Atheromatous calcification along the carotid
siphons. The left ICA is smaller than the right in the setting of
hypoplastic left A1 segment. Generalized atheromatous irregularity
of branches. Mild narrowing of the distal left M1 segment.

Posterior circulation: Left dominant vertebral artery. The vertebral
and basilar arteries are smoothly contoured and diffusely patent. No
branch occlusion, beading, or aneurysm.

Venous sinuses: Diffusely patent. Developmental venous anomaly in
the right frontal lobe.

Anatomic variants: As above

Review of the MIP images confirms the above findings
IMPRESSION: 1. No emergent finding.
2. Atherosclerosis without flow limiting stenosis of major arteries
in the head neck.

## 2021-12-19 MED ORDER — MELOXICAM 7.5 MG PO TABS
7.5000 mg | ORAL_TABLET | Freq: Every day | ORAL | Status: DC
  Administered 2021-12-19 – 2021-12-20 (×2): 7.5 mg via ORAL
  Filled 2021-12-19 (×2): qty 1

## 2021-12-19 MED ORDER — MIRABEGRON ER 25 MG PO TB24
25.0000 mg | ORAL_TABLET | Freq: Every day | ORAL | Status: DC
  Administered 2021-12-19 – 2021-12-20 (×2): 25 mg via ORAL
  Filled 2021-12-19 (×2): qty 1

## 2021-12-19 MED ORDER — CLOPIDOGREL BISULFATE 75 MG PO TABS
75.0000 mg | ORAL_TABLET | Freq: Every day | ORAL | Status: DC
  Administered 2021-12-19 – 2021-12-20 (×2): 75 mg via ORAL
  Filled 2021-12-19 (×2): qty 1

## 2021-12-19 MED ORDER — ASPIRIN 81 MG PO TBEC
81.0000 mg | DELAYED_RELEASE_TABLET | Freq: Every day | ORAL | Status: DC
  Administered 2021-12-19 – 2021-12-20 (×2): 81 mg via ORAL
  Filled 2021-12-19 (×2): qty 1

## 2021-12-19 MED ORDER — AMLODIPINE BESYLATE 5 MG PO TABS
5.0000 mg | ORAL_TABLET | Freq: Every day | ORAL | Status: DC
  Administered 2021-12-19 – 2021-12-20 (×2): 5 mg via ORAL
  Filled 2021-12-19 (×2): qty 1

## 2021-12-19 MED ORDER — POTASSIUM CHLORIDE CRYS ER 20 MEQ PO TBCR
40.0000 meq | EXTENDED_RELEASE_TABLET | Freq: Two times a day (BID) | ORAL | Status: AC
  Administered 2021-12-19 (×2): 40 meq via ORAL
  Filled 2021-12-19 (×2): qty 2

## 2021-12-19 MED ORDER — IOHEXOL 350 MG/ML SOLN
100.0000 mL | Freq: Once | INTRAVENOUS | Status: AC | PRN
  Administered 2021-12-19: 100 mL via INTRAVENOUS

## 2021-12-19 MED ORDER — PRAVASTATIN SODIUM 40 MG PO TABS
20.0000 mg | ORAL_TABLET | Freq: Every day | ORAL | Status: DC
  Administered 2021-12-19: 20 mg via ORAL
  Filled 2021-12-19: qty 1

## 2021-12-19 NOTE — TOC Progression Note (Signed)
Transition of Care [facility]) - Progression Note    Patient Details  Name: Karla Vincent MRN: [id number] Date of Birth: November 06, 1940  Transition [facility] CM/SW Contact  Beckie Busing, RN Phone [phone]  12/19/2021, 11:37 AM  Clinical Narrative:    Youth walker and 3in 1 ordered per [employer]. To be delivered to bedside.    Expected Discharge Plan: Home/Self Care Barriers to Discharge: Continued Medical Work up  Expected Discharge Plan and Services Expected Discharge Plan: Home/Self Care     Post Acute Care Choice: Durable Medical Equipment Living arrangements for the past 2 months: Single Family Home                 DME Arranged: Dan Humphreys youth DME Agency: [employer] Date DME Agency Contacted: 12/19/21   Representative spoke with at DME Agency: Leavy Cella             Social Determinants of Health (SDOH) Interventions    Readmission Risk Interventions     View : No data to display.

## 2021-12-19 NOTE — Evaluation (Signed)
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 HPI:  Pt is a 81 y/o female presenting on 5/28 with weakness to R UE/LE for the past 3-4 days. Admitted for acute L basal ganglia infarction. PMH includes: DM, HTN and memory deficits (taking medication for memory).  Pt's son lives with her and he is self employed - working full time most of the time per pt. .   Assessment / Plan / Recommendation Clinical Impression  [facility] Mental Scale examination administered with pt scoring 18/30 - normal being 27/30, mild deficit 21-26, significant deficit 1-20.  No aphasia present.  Pt has baseline cognitive deficits and she and her daughter advised that her performance on this test is likely normal.  She has minimal dysarthria with slightly imprecise articulation but she is intelligible.  Pt scored well on orientation, recall of paragraph level of information and visuospatial skills. 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  Reviewed findings and recommendations with pt and daughter.  Given pt is right handed, advised she use her smartphone to keep track of appointments etc and have family member "spot check" her medications to assure she is taking them accurately.    SLP Assessment  SLP Recommendation/Assessment: Patient does not need any further Speech Lanaguage Pathology Services    Recommendations for follow up therapy are one component of a multi-disciplinary  discharge planning process, led by the attending physician.  Recommendations may be updated based on patient status, additional functional criteria and insurance authorization.    Follow Up Recommendations  No SLP follow up    Assistance Recommended at Discharge  None  Functional Status Assessment Patient has not had a recent decline in their functional status  Frequency and Duration     N/a      SLP Evaluation Cognition  Overall Cognitive Status: Impaired/Different from baseline Orientation Level: Oriented to person;Oriented to place;Oriented to time;Oriented to situation Year: 2023 Month: May Day of Week: Correct Memory: Impaired (recalled 1/5 words independently) Memory Impairment: Retrieval deficit Awareness: Appears intact Problem Solving: Impaired Problem Solving Impairment: Verbal complex Safety/Judgment: Appears intact       Comprehension  Auditory Comprehension Overall Auditory Comprehension: Appears within functional limits for tasks assessed Yes/No Questions: Not tested Commands: Within Functional Limits Conversation: Complex Visual Recognition/Discrimination Discrimination: Within Function Limits Reading Comprehension Reading Status: Within funtional limits    Expression Expression Primary Mode of Expression: Verbal Verbal Expression Overall Verbal Expression: Appears within functional limits for tasks assessed Initiation: No impairment Repetition: No impairment Naming: Not tested Pragmatics: No impairment Non-Verbal Means of Communication: Not applicable Written Expression Dominant Hand: Right Written Expression: Unable to assess (comment) (DNT pt with right sided weakness)   Oral / Motor  Oral Motor/Sensory Function Overall Oral Motor/Sensory Function: Mild impairment Facial ROM: Reduced right;Suspected CN VII (facial) dysfunction Facial Symmetry: Abnormal symmetry right;Suspected CN VII (facial) dysfunction Lingual Symmetry: Within Functional  Limits Lingual Strength: Reduced;Suspected CN XII (hypoglossal) dysfunction Velum: Within Functional Limits Mandible: Within Functional Limits Motor Speech  Overall Motor Speech: Impaired Respiration: Within functional limits Resonance: Within functional limits Articulation: Impaired Intelligibility: Intelligible Motor Planning: Witnin functional limits            Chales Abrahams 12/19/2021, 10:18 AM Rolena Infante, MS [facility] SLP Acute Rehab Services Office [phone] Pager [phone]

## 2021-12-19 NOTE — Care Management Obs Status (Signed)
[place] NOTIFICATION   Patient Details  Name: Karla Vincent MRN: [id number] Date of Birth: 07/28/1940   Medicare Observation Status Notification Given:  Yes    Geralynn Ochs, LCSW 12/19/2021, 11:19 AM

## 2021-12-19 NOTE — Plan of Care (Signed)
  Problem: Coping: Goal: Will identify appropriate support needs Outcome: Progressing   

## 2021-12-19 NOTE — Evaluation (Signed)
Occupational Therapy Evaluation Patient Details Name: Karla Vincent Vincent: [id number] DOB: 08/20/1940 Today's Date: 12/19/2021   History of Present Illness Pt is a 81 y/o female presenting on 5/28 with weakness to R UE/LE for the past 3-4 days. Admitted for acute L basal ganglia infarction. PMH includes: DM, HTN.   Clinical Impression   PTA patient independent and driving. Admitted for above and presents with problem list below, including R sided weakness and decreased coordination, impaired balance and decreased safety awareness/problem solving.  Patient currently requires min guard to min assist for transfers, up to min assist for ADLs.  She was educated on functional use of dominant R UE and safety with ADLs.  Will have support of daughter at dc, plans to dc to her home for increased assist.  Will follow acutely with recommendations for OP OT at dc.         Recommendations for follow up therapy are one component of a multi-disciplinary discharge planning process, led by the attending physician.  Recommendations may be updated based on patient status, additional functional criteria and insurance authorization.   Follow Up Recommendations  Outpatient OT    Assistance Recommended at Discharge Intermittent Supervision/Assistance  Patient can return home with the following A little help with walking and/or transfers;A little help with bathing/dressing/bathroom;Assistance with cooking/housework;Direct supervision/assist for medications management;Direct supervision/assist for financial management;Help with stairs or ramp for entrance;Assist for transportation    Functional Status Assessment  Patient has had a recent decline in their functional status and demonstrates the ability to make significant improvements in function in a reasonable and predictable amount of time.  Equipment Recommendations  BSC/3in1    Recommendations for Other Services PT consult     Precautions / Restrictions  Precautions Precautions: Fall Restrictions Weight Bearing Restrictions: No      Mobility Bed Mobility Overal bed mobility: Needs Assistance Bed Mobility: Supine to Sit, Sit to Supine     Supine to sit: Supervision Sit to supine: Supervision   General bed mobility comments: increased time, no assist required    Transfers                          Balance Overall balance assessment: Needs assistance Sitting-balance support: No upper extremity supported, Feet supported Sitting balance-Leahy Scale: Fair     Standing balance support: During functional activity, Single extremity supported, Bilateral upper extremity supported Standing balance-Leahy Scale: Poor Standing balance comment: R hand held assist and reaching out for L UE support during mobility                           ADL either performed or assessed with clinical judgement   ADL Overall ADL's : Needs assistance/impaired     Grooming: Minimal assistance;Standing;Wash/dry hands           Upper Body Dressing : Minimal assistance;Sitting   Lower Body Dressing: Minimal assistance;Sit to/from stand   Toilet Transfer: Min guard;Ambulation Toilet Transfer Details (indicate cue type and reason): hand held assist Toileting- Clothing Manipulation and Hygiene: Minimal assistance;Sit to/from stand Toileting - Clothing Manipulation Details (indicate cue type and reason): clothing mgmt on R side     Functional mobility during ADLs: Min guard;Cueing for safety General ADL Comments: pt limited by impaired functional use and weakness of R UE, imapired balance and decreased activity tolerance     Vision Baseline Vision/History: 1 Wears glasses Ability to See in Adequate Light: 0  Adequate Patient Visual Report: No change from baseline (reading glasses) Vision Assessment?: No apparent visual deficits     Perception     Praxis      Pertinent Vitals/Pain Pain Assessment Pain Assessment:  0-10 Pain Score: 5  Pain Location: R shoulder Pain Descriptors / Indicators: Discomfort, Sore Pain Intervention(s): Limited activity within patient's tolerance, Monitored during session, Repositioned     Hand Dominance Right   Extremity/Trunk Assessment Upper Extremity Assessment Upper Extremity Assessment: RUE deficits/detail RUE Deficits / Details: grossly 3-/5 MMT grossly, decreased functional use due to weakness. decreased FMC. RUE Sensation: WNL RUE Coordination: decreased fine motor;decreased gross motor   Lower Extremity Assessment Lower Extremity Assessment: Defer to PT evaluation   Cervical / Trunk Assessment Cervical / Trunk Assessment: Normal   Communication Communication Communication: No difficulties   Cognition Arousal/Alertness: Awake/alert Behavior During Therapy: WFL for tasks assessed/performed Overall Cognitive Status: Impaired/Different from baseline Area of Impairment: Safety/judgement, Awareness, Problem solving, Memory                     Memory: Decreased short-term memory   Safety/Judgement: Decreased awareness of safety Awareness: Emergent Problem Solving: Slow processing, Difficulty sequencing, Requires verbal cues General Comments: pt able to follow commands but demonstrates decresaed safety and problem solving.     General Comments  daughter present and supportive    Exercises     Shoulder Instructions      Home Living Family/patient expects to be discharged to:: Private residence Living Arrangements: Children Available Help at Discharge: Family Type of Home: Mobile home Home Access: Ramped entrance     Home Layout: One level     Bathroom Shower/Tub: Producer, television/film/video: Standard     Home Equipment: None   Additional Comments: plans to go home with daughter for a few days to 1 level home with 2 steps to enter, walk in shower      Prior Functioning/Environment Prior Level of Function :  Independent/Modified Independent;Driving               ADLs Comments: independent ADLs, IADLs        OT Problem List: Decreased strength;Decreased activity tolerance;Impaired balance (sitting and/or standing);Impaired UE functional use;Decreased knowledge of precautions;Decreased knowledge of use of DME or AE;Decreased safety awareness;Decreased cognition;Decreased coordination      OT Treatment/Interventions: Self-care/ADL training;Therapeutic exercise;DME and/or AE instruction;Therapeutic activities;Patient/family education;Balance training;Cognitive remediation/compensation;Neuromuscular education    OT Goals(Current goals can be found in the care plan section) Acute Rehab OT Goals Patient Stated Goal: home, get better OT Goal Formulation: With patient Time For Goal Achievement: 01/02/22 Potential to Achieve Goals: Good  OT Frequency: Min 2X/week    Co-evaluation              AM-PAC OT "6 Clicks" Daily Activity     Outcome Measure Help from another person eating meals?: A Little Help from another person taking care of personal grooming?: A Little Help from another person toileting, which includes using toliet, bedpan, or urinal?: A Little Help from another person bathing (including washing, rinsing, drying)?: A Little Help from another person to put on and taking off regular upper body clothing?: A Little Help from another person to put on and taking off regular lower body clothing?: A Little 6 Click Score: 18   End of Session Nurse Communication: Mobility status  Activity Tolerance: Patient tolerated treatment well Patient left: in bed;with call bell/phone within reach;with nursing/sitter in room;with family/visitor present  OT Visit  Diagnosis: Other abnormalities of gait and mobility (R26.89);Other symptoms and signs involving the nervous system (R29.898)                Time: 2951-8841 OT Time Calculation (min): 26 min Charges:  OT General Charges $OT Visit: 1  Visit OT Evaluation $OT Eval Moderate Complexity: 1 Mod OT Treatments $Self Care/Home Management : 8-22 mins  Barry Brunner, OT Acute Rehabilitation Services Pager [phone] Office [phone]   Chancy Milroy 12/19/2021, 9:12 AM

## 2021-12-19 NOTE — Evaluation (Signed)
Physical Therapy Evaluation Patient Details Name: Karla Vincent MRN: [id number] DOB: 11/22/1940 Today's Date: 12/19/2021  History of Present Illness  Pt is a 81 y/o female presenting on 5/28 with weakness to R UE/LE for the past 3-4 days. Admitted for acute L basal ganglia infarction. PMH includes: DM, HTN.   Clinical Impression  Pt admitted with above. Pt presenting with R sided weakness UE worse than LE. Pt attempting to use R UE functionally however due to significant impairment with fine dexterity and grip pt having significant trouble. Pt requiring RW for safe ambulation at this time as pt constantly reaching for something to hold onto to steady self. Pt agrees that amb with RW improves her stability and safety with RW. Pt to benefit from outpt PT to progress towards indep transfers and ambulation as pt was PTA. Acute PT to cont to follow.       Recommendations for follow up therapy are one component of a multi-disciplinary discharge planning process, led by the attending physician.  Recommendations may be updated based on patient status, additional functional criteria and insurance authorization.  Follow Up Recommendations Outpatient PT    Assistance Recommended at Discharge Frequent or constant Supervision/Assistance  Patient can return home with the following  A little help with walking and/or transfers;A little help with bathing/dressing/bathroom;Assist for transportation;Help with stairs or ramp for entrance    Equipment Recommendations Rolling walker (2 wheels) (youth RW)  Recommendations for Other Services       Functional Status Assessment Patient has had a recent decline in their functional status and demonstrates the ability to make significant improvements in function in a reasonable and predictable amount of time.     Precautions / Restrictions Precautions Precautions: Fall Restrictions Weight Bearing Restrictions: No      Mobility  Bed Mobility Overal bed  mobility: Needs Assistance Bed Mobility: Supine to Sit     Supine to sit: Supervision     General bed mobility comments: increased time, no assist required,HOB slightly elevated    Transfers Overall transfer level: Needs assistance Equipment used: None, Rolling walker (2 wheels) Transfers: Sit to/from Stand Sit to Stand: Min guard           General transfer comment: verbal cues to push up from bed when standing up to walker, pulled self up using bar in bathroom when on commode, limited use of R UE when trying to push up from bed without AD    Ambulation/Gait Ambulation/Gait assistance: Min assist, Mod assist Gait Distance (Feet): 8 Feet (x2, 130x1 with RW) Assistive device: 1 person hand held assist, Rolling walker (2 wheels) Gait Pattern/deviations: Step-through pattern, Decreased stride length Gait velocity: decreased Gait velocity interpretation: <1.31 ft/sec, indicative of household ambulator   General Gait Details: pt initially given L HHA and then furniture walked to the bathroom. Pt with tight grip on PT with L UE. Once done in the bathroom pt used RW for amb in hallway and requiring occasional minA for walker management due to vearing to R, as expected due to R UE weakness and being able to push stronger with the L than the R. pt reports feeling more steady with RW, pt with more fluid gait pattern and increased stability in addition to cadence. Pt able to hold onto walker with R hand without letting it fall off  Stairs            Wheelchair Mobility    Modified Rankin (Stroke Patients Only) Modified Rankin (Stroke Patients Only) Pre-Morbid  Rankin Score: No symptoms Modified Rankin: Moderate disability     Balance Overall balance assessment: Needs assistance Sitting-balance support: No upper extremity supported, Feet supported Sitting balance-Leahy Scale: Fair     Standing balance support: During functional activity, Single extremity supported, Bilateral  upper extremity supported Standing balance-Leahy Scale: Poor Standing balance comment: pt steadied self on sink while washing hands s/p tolieting                             Pertinent Vitals/Pain Pain Assessment Pain Assessment: No/denies pain    Home Living Family/patient expects to be discharged to:: Private residence Living Arrangements: Children Available Help at Discharge: Family Type of Home: Mobile home Home Access: Ramped entrance       Home Layout: One level Home Equipment: None Additional Comments: plans to go home with daughter for a few days to 1 level home with 2 steps to enter, walk in shower    Prior Function Prior Level of Function : Independent/Modified Independent;Driving             Mobility Comments: indep ADLs Comments: independent ADLs, IADLs     Hand Dominance   Dominant Hand: Right    Extremity/Trunk Assessment   Upper Extremity Assessment Upper Extremity Assessment: RUE deficits/detail RUE Deficits / Details: grossly 3-/5 MMT grossly, decreased functional use due to weakness. decreased FMC. RUE Sensation: WNL RUE Coordination: decreased fine motor;decreased gross motor    Lower Extremity Assessment Lower Extremity Assessment: RLE deficits/detail RLE Deficits / Details: grossly 3+/5 RLE Sensation: WNL    Cervical / Trunk Assessment Cervical / Trunk Assessment: Normal  Communication   Communication: No difficulties  Cognition Arousal/Alertness: Awake/alert Behavior During Therapy: WFL for tasks assessed/performed Overall Cognitive Status: Impaired/Different from baseline Area of Impairment: Safety/judgement, Awareness, Problem solving, Memory                     Memory: Decreased short-term memory   Safety/Judgement: Decreased awareness of safety Awareness: Emergent Problem Solving: Slow processing, Difficulty sequencing, Requires verbal cues General Comments: pt following commands but requires increased  time, pt initatiating trying to use R UE despite significant weakness, pt aware she is more steady with RW when ambulating        General Comments General comments (skin integrity, edema, etc.): daughter present and supportive    Exercises     Assessment/Plan    PT Assessment Patient needs continued PT services  PT Problem List Decreased strength;Decreased range of motion;Decreased activity tolerance;Decreased balance;Decreased mobility;Decreased coordination;Decreased cognition;Decreased knowledge of use of DME;Decreased safety awareness       PT Treatment Interventions DME instruction;Gait training;Stair training;Functional mobility training;Therapeutic activities;Therapeutic exercise;Balance training;Neuromuscular re-education    PT Goals (Current goals can be found in the Care Plan section)  Acute Rehab PT Goals Patient Stated Goal: home PT Goal Formulation: With patient Time For Goal Achievement: 01/02/22 Potential to Achieve Goals: Good    Frequency Min 4X/week     Co-evaluation               AM-PAC PT "6 Clicks" Mobility  Outcome Measure Help needed turning from your back to your side while in a flat bed without using bedrails?: None Help needed moving from lying on your back to sitting on the side of a flat bed without using bedrails?: A Little Help needed moving to and from a bed to a chair (including a wheelchair)?: A Little Help needed standing up from  a chair using your arms (e.g., wheelchair or bedside chair)?: A Little Help needed to walk in hospital room?: A Little Help needed climbing 3-5 steps with a railing? : A Little 6 Click Score: 19    End of Session Equipment Utilized During Treatment: Gait belt Activity Tolerance: Patient tolerated treatment well Patient left: in bed;with call bell/phone within reach;with family/visitor present;with nursing/sitter in room (left pt EOB with RN tech for bath) Nurse Communication: Mobility status PT Visit  Diagnosis: Unsteadiness on feet (R26.81);Muscle weakness (generalized) (M62.81);Difficulty in walking, not elsewhere classified (R26.2)    Time: 6834-1962 PT Time Calculation (min) (ACUTE ONLY): 21 min   Charges:   PT Evaluation $PT Eval Moderate Complexity: 1 Mod          Lewis Shock, PT, DPT Acute Rehabilitation Services Secure chat preferred Office #: [phone]   Karla Vincent 12/19/2021, 11:47 AM

## 2021-12-19 NOTE — Progress Notes (Signed)
Internal Medicine Attending:   I saw and examined the patient. I reviewed the resident's H&P note and I agree with the resident's findings and plan as documented in the resident's note.  In brief, patient is an 81 year old female with a past medical history of hypertension, type 2 diabetes and hyperlipidemia who presented to the ED with right arm weakness x2 days.  Patient states that she noted right arm weakness and unsteady gait beginning 2 days prior to admission.  She went to urgent care 2 days ago and had an x-ray of the arm which did not reveal any issues.  She was brought to the ED by her daughter secondary to ongoing symptoms.  Patient has not been able to tolerate a statin as an outpatient secondary to joint pain.  In the ED, imaging was done which revealed left basal ganglier infarcts and patient was admitted to the internal medicine service for further management.  On exam, patient is oriented x3 and in no apparent distress.  Power is 5 out of 5 on the left and 4 out of 5 on the right with decreased grip strength in the left hand.  No facial droop, sensation intact, finger-to-nose test is within normal limits.  Shoulder shrug intact, tongue central, extraocular movements intact.  Cardiovascular exam reveals regular rate and rhythm with normal heart sounds.  Lungs were clear to auscultation bilaterally.  Abdomen is soft, nontender, nondistended with normoactive bowel sounds.  Mood and affect were normal  Patient was admitted to the hospital with acute left basal ganglia infarct noted on MRI brain.  Patient has multiple risk factors for CVA including hypertension, diabetes and hyperlipidemia (not on statin).  CTA head/neck showed no evidence of occlusion.  Patient's A1c is at goal at 6.6.  Patient noted to have an elevated LDL of 107.  Patient was started on pravastatin by neurology.  If patient unable to tolerate statin may consider bempedoic acid/PCSK9 inhibitor.  Continue with aspirin 81 mg daily  and Plavix 85 mg daily for now to complete a 3-week course and then continue with either aspirin or Plavix alone.  Stroke team to follow-up today.  We will follow-up results of 2D echo.  No further work-up at this time.  Patient antihypertensives were held on admission to allow for permissive hypertension but patient is now out of that window.  We will start patient on home amlodipine and consider resuming her losartan/HCTZ tomorrow.  Patient will likely be stable for DC home tomorrow.

## 2021-12-19 NOTE — Consult Note (Signed)
Stroke Neurology Consultation Note  Consult Requested by: Dr. Dareen Piano  Reason for Consult: stroke  Consult Date: 12/19/21   The history was obtained from the pt, her sons and daughter.  During history and examination, all items were able to obtain unless otherwise noted.  History of Present Illness:  Karla Vincent is a 81 y.o. Caucasian female with PMH of hypertension, hyperlipidemia, diabetes admitted for right arm weakness and difficulty with gait for 3 days.  Per patient and family she had acute right arm weakness and unsteady gait for 3 days.  Family also endorsed right facial droop and slurred speech.  However, patient and family stated that the day the facial droop and slurred speech much improved and speech close to her normal baseline.  Per daughter, her gait also improved, today working with PT/OT, using walker no significant dragging, and PT therapy recommend outpatient therapy.  MRI showed left BG/CR infarct.  CT head and neck unremarkable.  Neurology consulted for stroke.  LSN: 3 days tPA Given: No: Outside window  Past Medical History:  Diagnosis Date   Diabetes mellitus without complication (HCC)    GERD (gastroesophageal reflux disease)    Hypertension     No past surgical history on file.  No family history on file.  Social History:  has no history on file for tobacco use, alcohol use, and drug use.  Allergies:  Allergies  Allergen Reactions   Sulfa Antibiotics Itching   Codeine Nausea Only   Prednisone Nausea Only    No current facility-administered medications on file prior to encounter.   Current Outpatient Medications on File Prior to Encounter  Medication Sig Dispense Refill   acetaminophen (TYLENOL) 500 MG tablet Take 1,000 mg by mouth every 6 (six) hours as needed for mild pain or headache.     amLODipine (NORVASC) 5 MG tablet Take 5 mg by mouth at bedtime.     donepezil (ARICEPT) 5 MG tablet Take 5 mg by mouth at bedtime.     famotidine (PEPCID) 40  MG tablet Take 40 mg by mouth daily.     glipiZIDE (GLUCOTROL XL) 5 MG 24 hr tablet Take 5 mg by mouth at bedtime.     LORazepam (ATIVAN) 1 MG tablet Take 1 mg by mouth at bedtime.     losartan-hydrochlorothiazide (HYZAAR) 100-25 MG tablet Take 1 tablet by mouth daily. 30 tablet 1   meloxicam (MOBIC) 7.5 MG tablet Take 7.5 mg by mouth daily.      Review of Systems: A full ROS was attempted today and was able to be performed.  Systems assessed include - Constitutional, Eyes, HENT, Respiratory, Cardiovascular, Gastrointestinal, Genitourinary, Integument/breast, Hematologic/lymphatic, Musculoskeletal, Neurological, Behavioral/Psych, Endocrine, Allergic/Immunologic - with pertinent responses as per HPI.  Physical Examination: Temp:  [97.6 F (36.4 C)-98.5 F (36.9 C)] 98.2 F (36.8 C) (05/29 1529) Pulse Rate:  [75-90] 78 (05/29 1529) Resp:  [16-20] 18 (05/29 1529) BP: (143-180)/(60-80) 147/61 (05/29 1529) SpO2:  [95 %-100 %] 100 % (05/29 1529) Weight:  [68.5 kg] 68.5 kg (05/28 2027)  General - Well nourished, well developed, in no apparent distress.  Ophthalmologic - fundi not visualized due to noncooperation.  Cardiovascular - Regular rhythm and rate.  Mental Status -  Level of arousal and orientation to time, place, and person were intact. Language including expression, naming, repetition, comprehension was assessed and found intact. Slight dysarthria Fund of Knowledge was assessed and was intact.  Cranial Nerves II - XII - II - Visual field intact OU. III,  IV, VI - Extraocular movements intact. V - Facial sensation intact bilaterally. VII - Mild right facial droop. VIII - Hearing & vestibular intact bilaterally. X - Palate elevates symmetrically. XI - Chin turning & shoulder shrug intact bilaterally. XII - Tongue protrusion intact.  Motor Strength - The patient's strength was normal in LUE, but RUE proximal 3+/5 with pronator drift, right hand grip and finger movement 3-/5.   BLEs symmetrical 4/5 bilaterally. Bulk was normal and fasciculations were absent.   Motor Tone - Muscle tone was assessed at the neck and appendages and was normal.  Reflexes - The patient's reflexes were symmetrical in all extremities and she had no pathological reflexes.  Sensory - Light touch, temperature/pinprick were assessed and were symmetrical.    Coordination - The patient had normal movements in the hands with no ataxia or dysmetria although right FTN slow and incomplete.  Tremor was absent.  Gait and Station - deferred.  Data Reviewed: CT ANGIO HEAD NECK W WO CM  Result Date: 12/19/2021 CLINICAL DATA:  Stroke EXAM: CT ANGIOGRAPHY HEAD AND NECK TECHNIQUE: Multidetector CT imaging of the head and neck was performed using the standard protocol during bolus administration of intravenous contrast. Multiplanar CT image reconstructions and MIPs were obtained to evaluate the vascular anatomy. Carotid stenosis measurements (when applicable) are obtained utilizing NASCET criteria, using the distal internal carotid diameter as the denominator. RADIATION DOSE REDUCTION: This exam was performed according to the departmental dose-optimization program which includes automated exposure control, adjustment of the mA and/or kV according to patient size and/or use of iterative reconstruction technique. CONTRAST:  121mL OMNIPAQUE IOHEXOL 350 MG/ML SOLN COMPARISON:  At CT and brain MRI from yesterday FINDINGS: CT HEAD FINDINGS Brain: Acute infarct in the left corona radiata known from prior MRI. No visible progression or hemorrhage. Generalized cerebral volume loss. Vascular: See below Skull: Normal. Negative for fracture or focal lesion. Sinuses: Imaged portions are clear. Orbits: Negative Review of the MIP images confirms the above findings CTA NECK FINDINGS Aortic arch: Atheromatous plaque with 3 vessel branching. Right carotid system: Mild atheromatous plaque, especially for age. No stenosis or ulceration.  Left carotid system: Moderate calcified plaque at the bifurcation without flow limiting stenosis or ulceration. Vertebral arteries: No proximal subclavian stenosis. Low-density plaque mildly narrows the left vertebral artery at the V1 P2 junction. Negative for beading. Skeleton: No acute finding Other neck: No acute finding Upper chest: Negative Review of the MIP images confirms the above findings CTA HEAD FINDINGS Anterior circulation: Atheromatous calcification along the carotid siphons. The left ICA is smaller than the right in the setting of hypoplastic left A1 segment. Generalized atheromatous irregularity of branches. Mild narrowing of the distal left M1 segment. Posterior circulation: Left dominant vertebral artery. The vertebral and basilar arteries are smoothly contoured and diffusely patent. No branch occlusion, beading, or aneurysm. Venous sinuses: Diffusely patent. 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 Anatomic variants: As above Review of the MIP images confirms the above findings IMPRESSION: 1. No emergent finding. 2. Atherosclerosis without flow limiting stenosis of major arteries in the head neck. Electronically Signed   By: Jorje Guild M.D.   On: 12/19/2021 09:53   CT HEAD WO CONTRAST  Result Date: 12/18/2021 CLINICAL DATA:  Neuro deficit, acute stroke suspected EXAM: CT HEAD WITHOUT CONTRAST TECHNIQUE: Contiguous axial images were obtained from the base of the skull through the vertex without intravenous contrast. RADIATION DOSE REDUCTION: This exam was performed according to the departmental dose-optimization program which includes  automated exposure control, adjustment of the mA and/or kV according to patient size and/or use of iterative reconstruction technique. COMPARISON:  09/29/2021 FINDINGS: Brain: No evidence of acute infarction, hemorrhage, hydrocephalus, extra-axial collection or mass lesion/mass effect. Vascular: No hyperdense vessel or unexpected calcification.  Skull: Normal. Negative for fracture or focal lesion. Sinuses/Orbits: No acute finding. Other: None. IMPRESSION: No acute intracranial pathology. Electronically Signed   By: Delanna Ahmadi M.D.   On: 12/18/2021 14:04   MR BRAIN WO CONTRAST  Result Date: 12/18/2021 CLINICAL DATA:  Neuro deficit, acute, stroke suspected EXAM: MRI HEAD WITHOUT CONTRAST TECHNIQUE: Multiplanar, multiecho pulse sequences of the brain and surrounding structures were obtained without intravenous contrast. COMPARISON:  CT head from the same day. FINDINGS: Brain: Multiple small acute perforator infarcts in the left basal ganglia. Mild edema without mass effect. Additional mild scattered T2/FLAIR hyperintensities in the white matter, compatible with chronic microvascular ischemic disease as mild for age. Cerebral atrophy. No mass lesion, hydrocephalus, acute hemorrhage, or extra-axial fluid collection. Vascular: Major arterial flow voids are maintained skull base. Skull and upper cervical spine: Normal marrow signal. Sinuses/Orbits: Mild paranasal sinus mucosal thickening. No acute orbital findings. Other: No mastoid effusions. IMPRESSION: 1. Multiple small acute perforator infarcts in the left basal ganglia. 2.  Cerebral atrophy (ICD10-G31.9). Electronically Signed   By: Margaretha Sheffield M.D.   On: 12/18/2021 15:51   MR Cervical Spine Wo Contrast  Result Date: 12/18/2021 CLINICAL DATA:  Cervical radiculopathy, no red flags EXAM: MRI CERVICAL SPINE WITHOUT CONTRAST TECHNIQUE: Multiplanar, multisequence MR imaging of the cervical spine was performed. No intravenous contrast was administered. COMPARISON:  None Available. FINDINGS: Alignment: Slight anterolisthesis of C4 on C5 and C5 on C6. Vertebrae: Vertebral body heights are maintained. No focal marrow edema to suggest acute fracture discitis/osteomyelitis. No suspicious bone lesions. Cord: Normal cord signal. Posterior Fossa, vertebral arteries, paraspinal tissues: Visualized  vertebral artery flow voids are maintained. Unremarkable appearance of the visualized posterior fossa. Disc levels: C2-C3: Mild posterior disc osteophyte complex without significant canal or foraminal stenosis. C3-C4: Posterior disc osteophyte complex thigh for hand lesion which flattens the ventral cord with mild canal stenosis. No significant foraminal stenosis. C4-C5: Posterior disc osteophyte complex. Left facet uncovertebral hypertrophy. Mild canal and left foraminal stenosis. C5-C6: Posterior disc osteophyte complex with left greater than right facet and uncovertebral hypertrophy. Resulting mild-to-moderate left foraminal stenosis with mild canal stenosis. C6-C7: Posterior disc osteophyte complex. Left greater than right facet and uncovertebral hypertrophy. Resulting mild left foraminal stenosis without significant canal or right foraminal stenosis. C7-T1: No significant disc protrusion, foraminal stenosis, or canal stenosis. IMPRESSION: 1. 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 2. Mild canal stenosis at C3-C4, C4-C5, and C5-C6. Electronically Signed   By: Margaretha Sheffield M.D.   On: 12/18/2021 16:24   ECHOCARDIOGRAM COMPLETE  Result Date: 12/19/2021    ECHOCARDIOGRAM REPORT   Patient Name:   Karla Vincent Date of Exam: 12/19/2021 Medical Rec #:  [id number]      Height:       62.0 in Accession #:    [id number]     Weight:       151.0 lb Date of Birth:  11-Jun-1941      BSA:          1.697 m Patient Age:    54 years       BP:           153/72 mmHg Patient Gender: F  HR:           74 bpm. Exam Location:  Inpatient Procedure: 2D Echo, Cardiac Doppler and Color Doppler Indications:    Stroke  History:        Patient has no prior history of Echocardiogram examinations.                 Risk Factors:Hypertension and Diabetes.  Sonographer:    Eartha Inch Sonographer#2:  Johny Chess RDCS Referring Phys: [id number] NISCHAL NARENDRA IMPRESSIONS  1.  Left ventricular ejection fraction, by estimation, is 70 to 75%. The left ventricle has hyperdynamic function. The left ventricle has no regional wall motion abnormalities. There is mild left ventricular hypertrophy. Left ventricular diastolic parameters are consistent with Grade I diastolic dysfunction (impaired relaxation).  2. Right ventricular systolic function is normal. The right ventricular size is normal.  3. The mitral valve is normal in structure. Trivial mitral valve regurgitation. No evidence of mitral stenosis.  4. The aortic valve is tricuspid. Aortic valve regurgitation is not visualized. Aortic valve sclerosis is present, with no evidence of aortic valve stenosis.  5. The inferior vena cava is normal in size with greater than 50% respiratory variability, suggesting right atrial pressure of 3 mmHg. FINDINGS  Left Ventricle: Left ventricular ejection fraction, by estimation, is 70 to 75%. The left ventricle has hyperdynamic function. The left ventricle has no regional wall motion abnormalities. The left ventricular internal cavity size was normal in size. There is mild left ventricular hypertrophy. Left ventricular diastolic parameters are consistent with Grade I diastolic dysfunction (impaired relaxation). Right Ventricle: The right ventricular size is normal. Right ventricular systolic function is normal. Left Atrium: Left atrial size was normal in size. Right Atrium: Right atrial size was normal in size. Pericardium: There is no evidence of pericardial effusion. Mitral Valve: The mitral valve is normal in structure. Mild mitral annular calcification. Trivial mitral valve regurgitation. No evidence of mitral valve stenosis. Tricuspid Valve: The tricuspid valve is normal in structure. Tricuspid valve regurgitation is trivial. No evidence of tricuspid stenosis. Aortic Valve: The aortic valve is tricuspid. Aortic valve regurgitation is not visualized. Aortic valve sclerosis is present, with no evidence  of aortic valve stenosis. Pulmonic Valve: The pulmonic valve was not well visualized. Pulmonic valve regurgitation is not visualized. No evidence of pulmonic stenosis. Aorta: The aortic root is normal in size and structure. Venous: The inferior vena cava is normal in size with greater than 50% respiratory variability, suggesting right atrial pressure of 3 mmHg. IAS/Shunts: No atrial level shunt detected by color flow Doppler.  LEFT VENTRICLE PLAX 2D LVIDd:         3.60 cm   Diastology LVIDs:         2.40 cm   LV e' medial:    7.07 cm/s LV PW:         1.20 cm   LV E/e' medial:  13.1 LV IVS:        1.80 cm   LV e' lateral:   7.51 cm/s LVOT diam:     1.80 cm   LV E/e' lateral: 12.3 LV SV:         74 LV SV Index:   44 LVOT Area:     2.54 cm  RIGHT VENTRICLE RV S prime:     17.20 cm/s TAPSE (M-mode): 2.6 cm LEFT ATRIUM           Index        RIGHT ATRIUM  Index LA diam:      3.70 cm 2.18 cm/m   RA Area:     11.60 cm LA Vol (A4C): 38.5 ml 22.69 ml/m  RA Volume:   24.90 ml  14.68 ml/m  AORTIC VALVE LVOT Vmax:   137.00 cm/s LVOT Vmean:  95.400 cm/s LVOT VTI:    0.291 m  AORTA Ao Root diam: 2.70 cm Ao Asc diam:  3.40 cm MITRAL VALVE MV Area (PHT): 2.95 cm     SHUNTS MV Decel Time: 257 msec     Systemic VTI:  0.29 m MV E velocity: 92.70 cm/s   Systemic Diam: 1.80 cm MV A velocity: 120.00 cm/s MV E/A ratio:  0.77 Kirk Ruths MD Electronically signed by Kirk Ruths MD Signature Date/Time: 12/19/2021/3:02:23 PM    Final     Assessment: 81 y.o. female with history of hypertension, diabetes and hyperlipidemia admitted for right arm weakness, unsteady gait, dysarthria and right facial droop for 3 days.  Still has right upper extremity weakness, but dysarthria and right facial droop improved.  CT no acute epilepsy.  MRI showed left BG/CR infarct.  CTA head and neck unremarkable.  EF 70 to 75%.  LDL 107, A1c 6.6.  UDS negative.  Etiology for patient still likely small vessel disease.  Recommend further stroke  risk factor modification.  Continue aspirin 81 and the Plavix 75 DAPT for 3 weeks and then ASA alone.  Patient has statin intolerance in the past, but agreed to try low intensity pravastatin 20 at this time.  PT/OT recommend outpatient PT/OT.  Stroke Risk Factors - diabetes mellitus, hyperlipidemia, and hypertension  Plan: Recommend stroke risk factor modification.   Continue aspirin 81 and the Plavix 75 DAPT for 3 weeks and then ASA alone.   Patient has statin intolerance in the past, but agreed to try low intensity pravastatin 20 at this time.  PT/OT recommend outpatient PT/OT. OK to DC from neuro standpoint Follow-up with stroke clinic at [facility] in 4 weeks.  Thank you for this consultation and allowing [place] to participate in the care of this patient.  Neurology will sign off. Please call with questions. Pt will follow up with stroke clinic NP at [facility] in about 4 weeks.    Rosalin Hawking, MD PhD Stroke Neurology 12/19/2021 1:14 PM    To contact Stroke Continuity provider, please refer to [email or web address] After hours, contact General Neurology

## 2021-12-19 NOTE — TOC Initial Note (Signed)
Transition of Care [facility]) - Initial/Assessment Note    Patient Details  Name: Karla Vincent MRN: [id number] Date of Birth: 10-23-1940  Transition [facility] CM/SW Contact:    Geralynn Ochs, LCSW Phone Number: 12/19/2021, 11:33 AM  Clinical Narrative:          CSW met with patient and daughter at bedside to discuss outpatient recommendations and RW and 3N1. Patient agreeable to outpatient, preference for [facility]. CSW to send referral to [facility] and place information on AVS for patient to call and schedule. Patient also agreeable to walker, CSW to call into [employer] for delivery. CSW to follow for any additional needs for discharge.     Expected Discharge Plan: Home/Self Care Barriers to Discharge: Continued Medical Work up   Patient Goals and CMS Choice Patient states their goals for this hospitalization and ongoing recovery are:: to get home CMS Medicare.gov Compare Post Acute Care list provided to:: Patient Choice offered to / list presented to : Patient  Expected Discharge Plan and Services Expected Discharge Plan: Home/Self Care     Post Acute Care Choice: Durable Medical Equipment Living arrangements for the past 2 months: Single Family Home                 DME Arranged: Walker youth DME Agency: [employer] Date DME Agency Contacted: 12/19/21   Representative spoke with at DME Agency: Delana Meyer            Prior Living Arrangements/Services Living arrangements for the past 2 months: [place]   Patient language and need for interpreter reviewed:: No Do you feel safe going back to the place where you live?: Yes      Need for Family Participation in Patient Care: No (Comment) Care giver support system in place?: Yes (comment)   Criminal Activity/Legal Involvement Pertinent to Current Situation/Hospitalization: No - Comment as needed  Activities of Daily Living Home Assistive Devices/Equipment: CBG Meter, Blood pressure cuff, Eyeglasses ADL Screening  (condition at time of admission) Patient's cognitive ability adequate to safely complete daily activities?: Yes Is the patient deaf or have difficulty hearing?: No Does the patient have difficulty seeing, even when wearing glasses/contacts?: No Does the patient have difficulty concentrating, remembering, or making decisions?: No Patient able to express need for assistance with ADLs?: Yes Does the patient have difficulty dressing or bathing?: Yes Independently performs ADLs?: No Communication: Needs assistance Is this a change from baseline?: Change from baseline, expected to last >3 days Dressing (OT): Needs assistance Is this a change from baseline?: Change from baseline, expected to last >3 days Grooming: Needs assistance Is this a change from baseline?: Change from baseline, expected to last >3 days Feeding: Independent Bathing: Needs assistance Is this a change from baseline?: Change from baseline, expected to last >3 days Toileting: Needs assistance Is this a change from baseline?: Change from baseline, expected to last >3days In/Out Bed: Needs assistance Is this a change from baseline?: Change from baseline, expected to last >3 days Walks in Home: Needs assistance Is this a change from baseline?: Change from baseline, expected to last >3 days Does the patient have difficulty walking or climbing stairs?: Yes Weakness of Legs: Right Weakness of Arms/Hands: Right  Permission Sought/Granted                  Emotional Assessment Appearance:: Appears stated age Attitude/Demeanor/Rapport: Engaged Affect (typically observed): Appropriate Orientation: : Oriented to Self, Oriented to Place, Oriented to  Time, Oriented to Situation Alcohol /  Substance Use: Not Applicable Psych Involvement: No (comment)  Admission diagnosis:  Hypokalemia [E87.6] Acute CVA (cerebrovascular accident) ([place]) [I63.9] Infarction of left basal ganglia [facility]) [I63.9] Patient Active Problem List    Diagnosis Date Noted   Infarction of left basal ganglia ([place]) 12/18/2021   PCP:  Gennette Pac, NP Pharmacy:   [facility] [place], [address] [place] [address] Phone: [phone] Fax: [phone]     Social Determinants of Health (SDOH) Interventions    Readmission Risk Interventions     View : No data to display.

## 2021-12-19 NOTE — Progress Notes (Addendum)
   Subjective: No acute overnight events.   Patient was seen at bedside during rounds today. Pt reports feeling well and much improved compared to admission. Notes some ongoing right arm weakness. Denies any confusion or vision changes.   Pt is updated on the plan for today, and all questions and concerns are addressed.   Objective:  Vital signs in last 24 hours: Vitals:   12/18/21 2027 12/19/21 0017 12/19/21 0536 12/19/21 0741  BP: (!) 165/62 (!) 143/60 (!) 166/69 (!) 154/68  Pulse: 83 80 83 75  Resp: 17 16 16 20   Temp: 97.9 F (36.6 C) 98.2 F (36.8 C) 97.9 F (36.6 C) 98.5 F (36.9 C)  TempSrc: Oral Oral Oral Oral  SpO2: 96% 98% 97% 99%  Weight: 68.5 kg     Height: 5\' 2"  (1.575 m)      Constitutional: alert, well-appearing, in NAD  Eyes: conjunctiva non-erythematous, EOMI Cardiovascular: RRR, non-edematous bilateral LE Pulmonary/Chest: normal work of breathing on room air, LCTAB Abdominal: soft, non-tender to palpation, non-distended MSK: slightly reduced bulk and tone Neurological: A&O x 3 and follows commands. No facial droop. Sensation intact. Finger to nose/ cerebellar exam reassuring. Good hand grip, L>R. 5/5 strength on left side. 4/5 strength on right side. Gait testing not performed.  Skin: warm and dry   Assessment/Plan:  Principal Problem:   Infarction of left basal ganglia (HCC)  Karla Vincent is a 81 y.o. female with a pertinent PMH of HTN, HLD, and T2DM admitted for acute left basal ganglia infarction.   Acute left basal ganglia infarction  Ongoing residual right sided deficit. Started on DAPT. CT angio head and neck reassuring. Stroke team to assess today. OT recommending OP OT. PT will assess today. Anticipate improvement with ongoing therapy, and aggressive risk factor modifications.  - Follow up stroke team recommendations - Continue ASA 81 mg qd and Plavix 75 mg qd  - Pravastatin 20 mg started by stroke team  - A1c 6.6; is on Glipizide  - 2D echo  pending  - Outside of permissive HTN window, goal is to gradually normalize: - Resumed home Amlodipine  - Holding home Losartan-HCTZ    - Follow up PT/OT recs  - Fall precautions    HTN  BP 154/68, HR 75. She is three days out from onset of symptoms; outside of permissive HTN window, will gradually normalize BP.  - Resumed home Amlodipine  - Holding home Losartan-HCTZ      HLD LDL improved from 125 on 10/27/21 to 107 this admission. Pt reports that she is statin intolerant. LDL not at goal, <70.  - Pravastatin 20 mg started by stroke team    T2DM A1c 6.6 this admission. Well controlled. She is on Glipizide 5 mg daily. - CGM monitoring    Anxiety  - Home Lorazepam 1 mg daily prn   Best Practice: Diet: HH/Carb modified  IVF: None VTE: Enoxaparin Code: Full   94, MD  Internal Medicine Resident, PGY-1 Pager: [phone] After 5pm on weekdays and 1pm on weekends: On Call pager [phone]

## 2021-12-20 ENCOUNTER — Other Ambulatory Visit (HOSPITAL_COMMUNITY): Payer: Self-pay

## 2021-12-20 DIAGNOSIS — I10 Essential (primary) hypertension: Secondary | ICD-10-CM

## 2021-12-20 DIAGNOSIS — E119 Type 2 diabetes mellitus without complications: Secondary | ICD-10-CM

## 2021-12-20 DIAGNOSIS — E782 Mixed hyperlipidemia: Secondary | ICD-10-CM

## 2021-12-20 DIAGNOSIS — Z7984 Long term (current) use of oral hypoglycemic drugs: Secondary | ICD-10-CM

## 2021-12-20 DIAGNOSIS — E876 Hypokalemia: Secondary | ICD-10-CM

## 2021-12-20 LAB — BASIC METABOLIC PANEL
Anion gap: 5 (ref 5–15)
BUN: 13 mg/dL (ref 8–23)
CO2: 24 mmol/L (ref 22–32)
Calcium: 8.9 mg/dL (ref 8.9–10.3)
Chloride: 107 mmol/L (ref 98–111)
Creatinine, Ser: 0.75 mg/dL (ref 0.44–1.00)
GFR, Estimated: >60 mL/min (ref >60)
Glucose, Bld: 102 mg/dL — ABNORMAL HIGH (ref 70–99)
Potassium: 4.1 mmol/L (ref 3.5–5.1)
Sodium: 136 mmol/L (ref 135–145)

## 2021-12-20 MED ORDER — PRAVASTATIN SODIUM 20 MG PO TABS
20.0000 mg | ORAL_TABLET | Freq: Every day | ORAL | 0 refills | Status: DC
  Filled 2021-12-20: qty 30, 30d supply, fill #0

## 2021-12-20 MED ORDER — CLOPIDOGREL BISULFATE 75 MG PO TABS
75.0000 mg | ORAL_TABLET | Freq: Every day | ORAL | 0 refills | Status: DC
  Filled 2021-12-20: qty 17, 17d supply, fill #0

## 2021-12-20 MED ORDER — CLOPIDOGREL BISULFATE 75 MG PO TABS
75.0000 mg | ORAL_TABLET | Freq: Every day | ORAL | 0 refills | Status: AC
  Filled 2021-12-20: qty 19, 19d supply, fill #0

## 2021-12-20 MED ORDER — ASPIRIN 81 MG PO TBEC
81.0000 mg | DELAYED_RELEASE_TABLET | Freq: Every day | ORAL | 0 refills | Status: DC
  Filled 2021-12-20: qty 30, 30d supply, fill #0

## 2021-12-20 NOTE — Progress Notes (Signed)
Occupational Therapy Treatment Patient Details Name: Karla Vincent MRN: [id number] DOB: 1940/12/18 Today's Date: 12/20/2021   History of present illness Pt is a 81 y/o female presenting on 5/28 with weakness to R UE/LE for the past 3-4 days. Admitted for acute L basal ganglia infarction. PMH includes: DM, HTN.   OT comments  Patient seated in recliner, just finished PT session.  Patient provided R UE exercises (including theraputty and squeeze ball) for R UE strengthening, reviewed handouts and reinforced quality movements vs quantity due to fatigue. Patient engaged in grooming tasks seated with R UE elevated on pillows and using red built up handle with min to mod assist with R UE for proximal support due to weakness.  Educated on use of handle for meals for a few bites (with fork or finger foods) and using L hand when needing rest breaks.  Verbally discussed ADLs, safety and assist- pt and daughter very receptive to education.  Continue to recommend outpatient neuro OT at dc.  Will follow.    Recommendations for follow up therapy are one component of a multi-disciplinary discharge planning process, led by the attending physician.  Recommendations may be updated based on patient status, additional functional criteria and insurance authorization.    Follow Up Recommendations  Outpatient OT    Assistance Recommended at Discharge Frequent or constant Supervision/Assistance  Patient can return home with the following  A little help with walking and/or transfers;A little help with bathing/dressing/bathroom;Assistance with cooking/housework;Direct supervision/assist for medications management;Direct supervision/assist for financial management;Help with stairs or ramp for entrance;Assist for transportation   Equipment Recommendations  BSC/3in1    Recommendations for Other Services      Precautions / Restrictions Precautions Precautions: Fall Restrictions Weight Bearing Restrictions: No        Mobility Bed Mobility               General bed mobility comments: OOB upon entry    Transfers                         Balance Overall balance assessment: Needs assistance Sitting-balance support: No upper extremity supported, Feet supported Sitting balance-Leahy Scale: Fair                                     ADL either performed or assessed with clinical judgement   ADL Overall ADL's : Needs assistance/impaired Eating/Feeding: Moderate assistance;Sitting Eating/Feeding Details (indicate cue type and reason): using R hand with built up handle, hand over hand to support simulated self feeding.  Educated on propping up UE focusing on flex/extension of elbow, using fork with handle or finger foods for a few bites before resting. Grooming: Minimal assistance;Sitting;Oral care Grooming Details (indicate cue type and reason): using R hand to brush teeth, with bulit up handle and support for proximal weakness at elbow                               General ADL Comments: pt sitting in recliner, just finished with PT.  Session focused on seated ADLs.  Verbally reviewed safety with bathing, LB dressing and toileting with pt and daughter.  Educated on areas where she may need assistance, pt and daughter very receptive and voiced understanding to recommendations.    Extremity/Trunk Assessment  Vision       Perception     Praxis      Cognition Arousal/Alertness: Awake/alert Behavior During Therapy: WFL for tasks assessed/performed Overall Cognitive Status: Impaired/Different from baseline Area of Impairment: Safety/judgement, Awareness, Problem solving, Memory, Following commands                     Memory: Decreased short-term memory Following Commands: Follows one step commands with increased time Safety/Judgement: Decreased awareness of safety Awareness: Emergent Problem Solving: Slow processing, Requires  verbal cues, Difficulty sequencing General Comments: patient continues to require increased time to process and problem solve, able to recall hand placement from PT session.        Exercises Exercises: Other exercises Other Exercises Other Exercises: provided squeeze ball, theraputty and FMC exercises for pt to work on once dc'd.    Shoulder Instructions       General Comments VSS    Pertinent Vitals/ Pain       Pain Assessment Pain Assessment: No/denies pain  Home Living                                          Prior Functioning/Environment              Frequency  Min 2X/week        Progress Toward Goals  OT Goals(current goals can now be found in the care plan section)  Progress towards OT goals: Progressing toward goals  Acute Rehab OT Goals Patient Stated Goal: home OT Goal Formulation: With patient Time For Goal Achievement: 01/02/22 Potential to Achieve Goals: Good  Plan Discharge plan remains appropriate;Frequency remains appropriate    Co-evaluation                 AM-PAC OT "6 Clicks" Daily Activity     Outcome Measure   Help from another person eating meals?: A Little Help from another person taking care of personal grooming?: A Little Help from another person toileting, which includes using toliet, bedpan, or urinal?: A Little Help from another person bathing (including washing, rinsing, drying)?: A Little Help from another person to put on and taking off regular upper body clothing?: A Little Help from another person to put on and taking off regular lower body clothing?: A Little 6 Click Score: 18    End of Session    OT Visit Diagnosis: Other abnormalities of gait and mobility (R26.89);Other symptoms and signs involving the nervous system (R29.898)   Activity Tolerance Patient tolerated treatment well   Patient Left in chair;with call bell/phone within reach;with family/visitor present   Nurse Communication  Mobility status        Time: 7341-9379 OT Time Calculation (min): 24 min  Charges: OT General Charges $OT Visit: 1 Visit OT Treatments $Self Care/Home Management : 23-37 mins  Barry Brunner, OT Acute Rehabilitation Services Office [phone]   Chancy Milroy 12/20/2021, 10:47 AM

## 2021-12-20 NOTE — Discharge Summary (Signed)
Name: Karla Vincent MRN: [id number] DOB: Dec 08, 1940 81 y.o. PCP: Karla Pac, NP  Date of Admission: 12/18/2021 12:13 PM Date of Discharge:  12/20/21 Attending Physician: Dr. Cain Sieve  DISCHARGE DIAGNOSIS:  Primary Problem: Acute Infarction of left basal ganglia [facility])   Hospital Problems: Principal Problem:   Infarction of left basal ganglia (HCC)    DISCHARGE MEDICATIONS:   Allergies as of 12/20/2021       Reactions   Sulfa Antibiotics Itching   Codeine Nausea Only   Prednisone Nausea Only        Medication List     TAKE these medications    acetaminophen 500 MG tablet Commonly known as: TYLENOL Take 1,000 mg by mouth every 6 (six) hours as needed for mild pain or headache.   amLODipine 5 MG tablet Commonly known as: NORVASC Take 5 mg by mouth at bedtime.   aspirin EC 81 MG tablet Take 1 tablet (81 mg total) by mouth daily. Swallow whole.   clopidogrel 75 MG tablet Commonly known as: PLAVIX Take 1 tablet (75 mg total) by mouth daily for 19 days.   donepezil 5 MG tablet Commonly known as: ARICEPT Take 5 mg by mouth at bedtime.   famotidine 40 MG tablet Commonly known as: PEPCID Take 40 mg by mouth daily.   glipiZIDE 5 MG 24 hr tablet Commonly known as: GLUCOTROL XL Take 5 mg by mouth at bedtime.   LORazepam 1 MG tablet Commonly known as: ATIVAN Take 1 mg by mouth at bedtime.   losartan-hydrochlorothiazide 100-25 MG tablet Commonly known as: Hyzaar Take 1 tablet by mouth daily.   meloxicam 7.5 MG tablet Commonly known as: MOBIC Take 7.5 mg by mouth daily.   pravastatin 20 MG tablet Commonly known as: PRAVACHOL Take 1 tablet (20 mg total) by mouth daily at 6 PM.               Durable Medical Equipment  (From admission, onward)           Start     Ordered   12/19/21 1136  For home use only DME 3 n 1  Once        12/19/21 1135   12/19/21 1136  For home use only DME Walker youth  Once       Question:  Patient needs a walker  to treat with the following condition  Answer:  Weakness   12/19/21 1135            DISPOSITION AND FOLLOW-UP:  Ms.Karla Vincent was discharged from [facility] in stable condition. At the hospital follow up visit please address:  Follow-up Recommendations: Consults: Ensure Neurology follow up. Outpatient PT/OT Labs: CBC, BMP Studies: none  Medications: DAPT with ASA and Plavix for 21 days, and then Aspirin alone. Continue Pravastatin 20 mg daily.   Follow-up Appointments:  Follow-up Information     [facility]. Schedule an appointment as soon as possible for a visit in 1 month(s).   Specialty: Neurology Why: stroke clinic Contact information: [place] [phone]        Karla Pac, NP. Schedule an appointment as soon as possible for a visit in 1 week(s).   Specialty: Internal Medicine Why: Call your PCP to schedule a post hospital follow up visit within 1 week of discharge. Please call today to schedule this appointment! Contact information: [id number] [place] [address] [phone]  HOSPITAL COURSE:  Patient Summary: Acute left basal ganglia infarction  Hx of HTN Hx of HLD  81 year old female with a past medical history of hypertension, type 2 diabetes and hyperlipidemia who presented to the ED with right arm weakness x2 days. Patient states that she noted right arm weakness and unsteady gait beginning 2 days prior to admission. She went to urgent care 2 days ago and had an x-ray of the arm which did not reveal any issues. She was brought to the ED by her daughter secondary to ongoing symptoms. Patient has not been able to tolerate a statin as an outpatient secondary to joint pain. In the ED, imaging was done which revealed left basal ganglier infarcts and patient was admitted to the internal medicine service for further  management. Patient was admitted to the hospital with acute left basal ganglia infarct noted on MRI brain. On exam, patient is oriented x3 and in no apparent distress. Power is 5 out of 5 on the left and 4 out of 5 on the right with decreased grip strength in the left hand. No facial droop, sensation intact, finger-to-nose test is within normal limits. Shoulder shrug intact, tongue central, extraocular movements intact. Patient has multiple risk factors for CVA including hypertension, diabetes and hyperlipidemia (not on statin).  CTA head/neck showed no evidence of occlusion. 2D echo obtained was reassuring. Patient's A1c is at goal at 6.6.  Patient noted to have an elevated LDL of 107.  Patient was started on pravastatin 20 mg daily by neurology. If patient unable to tolerate statin may consider bempedoic acid/PCSK9 inhibitor. Continue with aspirin 81 mg daily and Plavix 85 mg daily for now to complete a 3-week course and then continue with aspirin alone. Patient antihypertensives were held on admission to allow for permissive hypertension but patient is now out of that window so both amlodipine and Losartan-HCTZ were resumed at discharge. PT/OP recommending outpatient PT/OT -- please ensure compliance. Cleared by stroke team for discharge with follow up at [facility] in about 4 weeks; please ensure follow up.    T2DM A1c 6.1 on 10/13/21 and 6.6 this admission. Well controlled. She is on Glipizide 5 mg daily. CBG's well controlled this admission.   Anxiety  On Lorazepam 1 mg as needed at bedtime. Reports daily use.  - Resumed home Lorazepam 1 mg daily prn    DISCHARGE INSTRUCTIONS:   Discharge Instructions     Ambulatory referral to Neurology   Complete by: As directed    Follow up with stroke clinic NP (Karla Vincent or Karla Vincent, if both not available, consider Karla Vincent, or Karla Vincent) at [facility] in about 4 weeks. Thanks.   Ambulatory referral to Occupational Therapy    Complete by: As directed    Ambulatory referral to Physical Therapy   Complete by: As directed    Call MD for:  difficulty breathing, headache or visual disturbances   Complete by: As directed    Call MD for:  extreme fatigue   Complete by: As directed    Call MD for:  hives   Complete by: As directed    Call MD for:  persistant dizziness or light-headedness   Complete by: As directed    Call MD for:  persistant nausea and vomiting   Complete by: As directed    Call MD for:  redness, tenderness, or signs of infection (pain, swelling, redness, odor or green/yellow discharge around incision site)   Complete by: As directed    Call  MD for:  severe uncontrolled pain   Complete by: As directed    Call MD for:  temperature >100.4   Complete by: As directed    Diet - low sodium heart healthy   Complete by: As directed    Increase activity slowly   Complete by: As directed        SUBJECTIVE:  No acute overnight events.    Patient was seen at bedside during rounds today. She continues to feel well and much improved compared to admission. Notes some ongoing residual right arm weakness. Denies any confusion or vision changes.   All questions were addressed with patient prior to being discharged.   Discharge Vitals:   BP 133/65 (BP Location: Left Arm)   Pulse 82   Temp (!) 97.5 F (36.4 C) (Oral)   Resp 20   Ht 5\' 2"  (1.575 m)   Wt 68.5 kg   SpO2 97%   BMI 27.62 kg/m   OBJECTIVE:  Constitutional: alert, well-appearing, in NAD  Eyes: conjunctiva non-erythematous, EOMI Cardiovascular: RRR, non-edematous bilateral LE Pulmonary/Chest: normal work of breathing on room air, LCTAB Abdominal: soft, non-tender to palpation, non-distended MSK: slightly reduced bulk and tone Neurological: A&O x 3 and follows commands. No facial droop. Sensation intact. Good hand grip, L>R. 5/5 strength on left side. 4/5 strength on right side.  Skin: warm and dry  Pertinent Labs, Studies, and  Procedures:     Latest Ref Rng & Units 12/19/2021   12:39 AM 12/18/2021    3:03 PM 12/18/2021    1:33 PM  CBC  WBC 4.0 - 10.5 K/uL 7.3      Hemoglobin 12.0 - 15.0 g/dL 12.8   14.6   13.6    Hematocrit 36.0 - 46.0 % 38.1   43.0   40.0    Platelets 150 - 400 K/uL 146           Latest Ref Rng & Units 12/20/2021    2:39 AM 12/19/2021   12:39 AM 12/18/2021    3:03 PM  CMP  Glucose 70 - 99 mg/dL 102   115   171    BUN 8 - 23 mg/dL 13   14   17     Creatinine 0.44 - 1.00 mg/dL 0.75   0.97   0.90    Sodium 135 - 145 mmol/L 136   139   141    Potassium 3.5 - 5.1 mmol/L 4.1   3.0   3.1    Chloride 98 - 111 mmol/L 107   108   104    CO2 22 - 32 mmol/L 24   26     Calcium 8.9 - 10.3 mg/dL 8.9   8.5       CT ANGIO HEAD NECK W WO CM  Result Date: 12/19/2021 CLINICAL DATA:  Stroke EXAM: CT ANGIOGRAPHY HEAD AND NECK TECHNIQUE: Multidetector CT imaging of the head and neck was performed using the standard protocol during bolus administration of intravenous contrast. Multiplanar CT image reconstructions and MIPs were obtained to evaluate the vascular anatomy. Carotid stenosis measurements (when applicable) are obtained utilizing NASCET criteria, using the distal internal carotid diameter as the denominator. RADIATION DOSE REDUCTION: This exam was performed according to the departmental dose-optimization program which includes automated exposure control, adjustment of the mA and/or kV according to patient size and/or use of iterative reconstruction technique. CONTRAST:  151mL OMNIPAQUE IOHEXOL 350 MG/ML SOLN COMPARISON:  At CT and brain MRI from yesterday FINDINGS: CT HEAD FINDINGS  Brain: Acute infarct in the left corona radiata known from prior MRI. No visible progression or hemorrhage. Generalized cerebral volume loss. Vascular: See below Skull: Normal. Negative for fracture or focal lesion. Sinuses: Imaged portions are clear. Orbits: Negative Review of the MIP images confirms the above findings CTA NECK  FINDINGS Aortic arch: Atheromatous plaque with 3 vessel branching. Right carotid system: Mild atheromatous plaque, especially for age. No stenosis or ulceration. Left carotid system: Moderate calcified plaque at the bifurcation without flow limiting stenosis or ulceration. Vertebral arteries: No proximal subclavian stenosis. Low-density plaque mildly narrows the left vertebral artery at the V1 P2 junction. Negative for beading. Skeleton: No acute finding Other neck: No acute finding Upper chest: Negative Review of the MIP images confirms the above findings CTA HEAD FINDINGS Anterior circulation: Atheromatous calcification along the carotid siphons. The left ICA is smaller than the right in the setting of hypoplastic left A1 segment. Generalized atheromatous irregularity of branches. Mild narrowing of the distal left M1 segment. Posterior circulation: Left dominant vertebral artery. The vertebral and basilar arteries are smoothly contoured and diffusely patent. No branch occlusion, beading, or aneurysm. Venous sinuses: Diffusely patent. 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 Anatomic variants: As above Review of the MIP images confirms the above findings IMPRESSION: 1. No emergent finding. 2. Atherosclerosis without flow limiting stenosis of major arteries in the head neck. Electronically Signed   By: Jorje Guild M.D.   On: 12/19/2021 09:53   CT HEAD WO CONTRAST  Result Date: 12/18/2021 CLINICAL DATA:  Neuro deficit, acute stroke suspected EXAM: CT HEAD WITHOUT CONTRAST TECHNIQUE: Contiguous axial images were obtained from the base of the skull through the vertex without intravenous contrast. RADIATION DOSE REDUCTION: This exam was performed according to the departmental dose-optimization program which includes automated exposure control, adjustment of the mA and/or kV according to patient size and/or use of iterative reconstruction technique. COMPARISON:  09/29/2021 FINDINGS: Brain: No  evidence of acute infarction, hemorrhage, hydrocephalus, extra-axial collection or mass lesion/mass effect. Vascular: No hyperdense vessel or unexpected calcification. Skull: Normal. Negative for fracture or focal lesion. Sinuses/Orbits: No acute finding. Other: None. IMPRESSION: No acute intracranial pathology. Electronically Signed   By: Delanna Ahmadi M.D.   On: 12/18/2021 14:04   MR BRAIN WO CONTRAST  Result Date: 12/18/2021 CLINICAL DATA:  Neuro deficit, acute, stroke suspected EXAM: MRI HEAD WITHOUT CONTRAST TECHNIQUE: Multiplanar, multiecho pulse sequences of the brain and surrounding structures were obtained without intravenous contrast. COMPARISON:  CT head from the same day. FINDINGS: Brain: Multiple small acute perforator infarcts in the left basal ganglia. Mild edema without mass effect. Additional mild scattered T2/FLAIR hyperintensities in the white matter, compatible with chronic microvascular ischemic disease as mild for age. Cerebral atrophy. No mass lesion, hydrocephalus, acute hemorrhage, or extra-axial fluid collection. Vascular: Major arterial flow voids are maintained skull base. Skull and upper cervical spine: Normal marrow signal. Sinuses/Orbits: Mild paranasal sinus mucosal thickening. No acute orbital findings. Other: No mastoid effusions. IMPRESSION: 1. Multiple small acute perforator infarcts in the left basal ganglia. 2.  Cerebral atrophy (ICD10-G31.9). Electronically Signed   By: Margaretha Sheffield M.D.   On: 12/18/2021 15:51   MR Cervical Spine Wo Contrast  Result Date: 12/18/2021 CLINICAL DATA:  Cervical radiculopathy, no red flags EXAM: MRI CERVICAL SPINE WITHOUT CONTRAST TECHNIQUE: Multiplanar, multisequence MR imaging of the cervical spine was performed. No intravenous contrast was administered. COMPARISON:  None Available. FINDINGS: Alignment: Slight anterolisthesis of C4 on C5 and C5 on C6. Vertebrae:  Vertebral body heights are maintained. No focal marrow edema to suggest  acute fracture discitis/osteomyelitis. No suspicious bone lesions. Cord: Normal cord signal. Posterior Fossa, vertebral arteries, paraspinal tissues: Visualized vertebral artery flow voids are maintained. Unremarkable appearance of the visualized posterior fossa. Disc levels: C2-C3: Mild posterior disc osteophyte complex without significant canal or foraminal stenosis. C3-C4: Posterior disc osteophyte complex thigh for hand lesion which flattens the ventral cord with mild canal stenosis. No significant foraminal stenosis. C4-C5: Posterior disc osteophyte complex. Left facet uncovertebral hypertrophy. Mild canal and left foraminal stenosis. C5-C6: Posterior disc osteophyte complex with left greater than right facet and uncovertebral hypertrophy. Resulting mild-to-moderate left foraminal stenosis with mild canal stenosis. C6-C7: Posterior disc osteophyte complex. Left greater than right facet and uncovertebral hypertrophy. Resulting mild left foraminal stenosis without significant canal or right foraminal stenosis. C7-T1: No significant disc protrusion, foraminal stenosis, or canal stenosis. IMPRESSION: 1. 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 2. Mild canal stenosis at C3-C4, C4-C5, and C5-C6. Electronically Signed   By: Margaretha Sheffield M.D.   On: 12/18/2021 16:24   ECHOCARDIOGRAM COMPLETE  Result Date: 12/19/2021    ECHOCARDIOGRAM REPORT   Patient Name:   GIOVANNINA SPADEA Date of Exam: 12/19/2021 Medical Rec #:  [id number]      Height:       62.0 in Accession #:    [id number]     Weight:       151.0 lb Date of Birth:  01-04-1941      BSA:          1.697 m Patient Age:    67 years       BP:           153/72 mmHg Patient Gender: F              HR:           74 bpm. Exam Location:  Inpatient Procedure: 2D Echo, Cardiac Doppler and Color Doppler Indications:    Stroke  History:        Patient has no prior history of Echocardiogram examinations.                 Risk  Factors:Hypertension and Diabetes.  Sonographer:    Eartha Inch Sonographer#2:  Johny Chess RDCS Referring Phys: [id number] NISCHAL NARENDRA IMPRESSIONS  1. Left ventricular ejection fraction, by estimation, is 70 to 75%. The left ventricle has hyperdynamic function. The left ventricle has no regional wall motion abnormalities. There is mild left ventricular hypertrophy. Left ventricular diastolic parameters are consistent with Grade I diastolic dysfunction (impaired relaxation).  2. Right ventricular systolic function is normal. The right ventricular size is normal.  3. The mitral valve is normal in structure. Trivial mitral valve regurgitation. No evidence of mitral stenosis.  4. The aortic valve is tricuspid. Aortic valve regurgitation is not visualized. Aortic valve sclerosis is present, with no evidence of aortic valve stenosis.  5. The inferior vena cava is normal in size with greater than 50% respiratory variability, suggesting right atrial pressure of 3 mmHg. FINDINGS  Left Ventricle: Left ventricular ejection fraction, by estimation, is 70 to 75%. The left ventricle has hyperdynamic function. The left ventricle has no regional wall motion abnormalities. The left ventricular internal cavity size was normal in size. There is mild left ventricular hypertrophy. Left ventricular diastolic parameters are consistent with Grade I diastolic dysfunction (impaired relaxation). Right Ventricle: The right ventricular size is normal. Right ventricular systolic  function is normal. Left Atrium: Left atrial size was normal in size. Right Atrium: Right atrial size was normal in size. Pericardium: There is no evidence of pericardial effusion. Mitral Valve: The mitral valve is normal in structure. Mild mitral annular calcification. Trivial mitral valve regurgitation. No evidence of mitral valve stenosis. Tricuspid Valve: The tricuspid valve is normal in structure. Tricuspid valve regurgitation is trivial. No evidence of  tricuspid stenosis. Aortic Valve: The aortic valve is tricuspid. Aortic valve regurgitation is not visualized. Aortic valve sclerosis is present, with no evidence of aortic valve stenosis. Pulmonic Valve: The pulmonic valve was not well visualized. Pulmonic valve regurgitation is not visualized. No evidence of pulmonic stenosis. Aorta: The aortic root is normal in size and structure. Venous: The inferior vena cava is normal in size with greater than 50% respiratory variability, suggesting right atrial pressure of 3 mmHg. IAS/Shunts: No atrial level shunt detected by color flow Doppler.  LEFT VENTRICLE PLAX 2D LVIDd:         3.60 cm   Diastology LVIDs:         2.40 cm   LV e' medial:    7.07 cm/s LV PW:         1.20 cm   LV E/e' medial:  13.1 LV IVS:        1.80 cm   LV e' lateral:   7.51 cm/s LVOT diam:     1.80 cm   LV E/e' lateral: 12.3 LV SV:         74 LV SV Index:   44 LVOT Area:     2.54 cm  RIGHT VENTRICLE RV S prime:     17.20 cm/s TAPSE (M-mode): 2.6 cm LEFT ATRIUM           Index        RIGHT ATRIUM           Index LA diam:      3.70 cm 2.18 cm/m   RA Area:     11.60 cm LA Vol (A4C): 38.5 ml 22.69 ml/m  RA Volume:   24.90 ml  14.68 ml/m  AORTIC VALVE LVOT Vmax:   137.00 cm/s LVOT Vmean:  95.400 cm/s LVOT VTI:    0.291 m  AORTA Ao Root diam: 2.70 cm Ao Asc diam:  3.40 cm MITRAL VALVE MV Area (PHT): 2.95 cm     SHUNTS MV Decel Time: 257 msec     Systemic VTI:  0.29 m MV E velocity: 92.70 cm/s   Systemic Diam: 1.80 cm MV A velocity: 120.00 cm/s MV E/A ratio:  0.77 Kirk Ruths MD Electronically signed by Kirk Ruths MD Signature Date/Time: 12/19/2021/3:02:23 PM    Final       Lajean Manes, MD Internal Medicine Resident, PGY-1 Pager: [phone]

## 2021-12-20 NOTE — Discharge Instructions (Signed)
Karla Vincent you were admitted to [facility] Internal Medicine Service because of a stroke.   We treated you by starting medications, observing you, working with pt/ot, and having our neurology team evaluate you. You are improved significantly with this treatment, and we continued to monitor you until you were stable enough for discharge.   PLEASE continue to take your medications as prescribed by your doctor, with some notable changes including: start taking aspirin daily, take plavix for 19 more days, and continue taking Pravastatin.   PLEASE do not miss any doses of your medications as it is very important in ensuring you continue to feel better and remain stable. Please follow up with Neurology.   You need to follow up with your primary care doctor by calling their office within the next week to create an appointment to discuss the events of this hospitalization, and to determine the best management plan for you and your conditions, to prevent you from having to return to the hospital. ADDITIONALLY, please go to any other appointments made for you in this discharge information.  RETURN to the ED if you have similar or worsening symptoms, and do not feel like your normal self.

## 2021-12-20 NOTE — TOC Transition Note (Signed)
Transition of Care [facility]) - CM/SW Discharge Note   Patient Details  Name: KEIERA WEISLER MRN: [id number] Date of Birth: 1941/03/06  [facility] CM/SW Contact:  Geralynn Ochs, LCSW Phone Number: 12/20/2021, 9:58 AM   Clinical Narrative:   CSW confirmed DME was delivered, sent outpatient referrals to [facility] and placed information on patient's AVS. No other TOC needs at this time, patient discharging home.    Final next level of care: Home/Self Care Barriers to Discharge: Barriers Resolved   Patient Goals and CMS Choice Patient states their goals for this hospitalization and ongoing recovery are:: to get home CMS Medicare.gov Compare Post Acute Care list provided to:: Patient Choice offered to / list presented to : Patient  Discharge Placement                Patient to be transferred to facility by: Family Name of family member notified: Self Patient and family notified of of transfer: 12/20/21  Discharge Plan and Services     Post Acute Care Choice: Durable Medical Equipment          DME Arranged: [facility] youth DME Agency: [employer] Date DME Agency Contacted: 12/19/21   Representative spoke with at DME Agency: [place] Interventions     Readmission Risk Interventions     View : No data to display.

## 2021-12-20 NOTE — Progress Notes (Signed)
Physical Therapy Treatment Patient Details Name: Karla Vincent MRN: [id number] DOB: 1940-11-18 Today's Date: 12/20/2021   History of Present Illness Pt is a 81 y/o female presenting on 5/28 with weakness to R UE/LE for the past 3-4 days. Admitted for acute L basal ganglia infarction. PMH includes: DM, HTN.    PT Comments    Pt with decreased R UE strength today with delayed processing, R inattention, difficulty following multistep commands, poor STM, and impaired balance. Pt did  complete necessary stair negotiation to return home with daughter and continues to be the most stable with RW when ambulating. Acute PT to cont to follow. Pt to cont to benefit from outpt PT to address above deficits.    Recommendations for follow up therapy are one component of a multi-disciplinary discharge planning process, led by the attending physician.  Recommendations may be updated based on patient status, additional functional criteria and insurance authorization.  Follow Up Recommendations  Outpatient PT     Assistance Recommended at Discharge Frequent or constant Supervision/Assistance  Patient can return home with the following A little help with walking and/or transfers;A little help with bathing/dressing/bathroom;Assist for transportation;Help with stairs or ramp for entrance   Equipment Recommendations  Rolling walker (2 wheels) (youth RW)    Recommendations for Other Services       Precautions / Restrictions Precautions Precautions: Fall Restrictions Weight Bearing Restrictions: No     Mobility  Bed Mobility Overal bed mobility: Needs Assistance Bed Mobility: Supine to Sit     Supine to sit: Min guard     General bed mobility comments: worked on transfer out of L side of bed without use of bed rails to mimic home set up. max directional verbal cues for technique, used edge of bed with L UE to pull self up, tactile cues to bring R UE infront of self intead of dragging it behind  her    Transfers Overall transfer level: Needs assistance Equipment used: Rolling walker (2 wheels) Transfers: Sit to/from Stand Sit to Stand: Min guard           General transfer comment: verbal cues to push up from bed when standing up to walker and to reach back when sitting down, pt with no carry over, over 4 trials    Ambulation/Gait Ambulation/Gait assistance: Min assist Gait Distance (Feet): 200 Feet Assistive device: Rolling walker (2 wheels) Gait Pattern/deviations: Step-through pattern, Decreased stride length Gait velocity: decreased Gait velocity interpretation: <1.31 ft/sec, indicative of household ambulator   General Gait Details: pt with decreased R hand grip on walker however was able to maintain grip t/o amb and not let if fall off, pt with vearing to the R due to stronger push with the LUE   Stairs Stairs: Yes Stairs assistance: Min guard Stair Management: One rail Right, Step to pattern, Sideways Number of Stairs: 2 (x3 trials) General stair comments: pt with R HR at home however has a weak R UE, instructed on sideways technique to allow for pt to ascend with L LE and to be able to hold onto railing with both L and R UEs   Wheelchair Mobility    Modified Rankin (Stroke Patients Only) Modified Rankin (Stroke Patients Only) Pre-Morbid Rankin Score: No symptoms Modified Rankin: Moderate disability     Balance Overall balance assessment: Needs assistance Sitting-balance support: No upper extremity supported, Feet supported Sitting balance-Leahy Scale: Fair     Standing balance support: During functional activity, Single extremity supported, Bilateral upper extremity supported  Standing balance-Leahy Scale: Poor Standing balance comment: dependent on external support                            Cognition Arousal/Alertness: Awake/alert Behavior During Therapy: WFL for tasks assessed/performed Overall Cognitive Status: Impaired/Different  from baseline Area of Impairment: Safety/judgement, Awareness, Problem solving, Memory, Following commands                     Memory: Decreased short-term memory Following Commands: Follows multi-step commands inconsistently, Follows one step commands inconsistently, Follows one step commands with increased time (difficulty with L/R directional commands) Safety/Judgement: Decreased awareness of safety Awareness: Emergent Problem Solving: Slow processing, Difficulty sequencing, Requires verbal cues General Comments: pt with noted delayed processing, poor carry over of safe hand placement with with sit to stand and was unable to tell daughter how to safely ascend/descend stairs using sideways technique with R hand rail, pt with R sided inattention today        Exercises Other Exercises Other Exercises: worked on sit to stand via pushing up with R UE and R LE, L LE held out into extension and L UE across front of abdomen, modA from PT to help power up, focused on R UE and LE strengthening, 10 reps Other Exercises: Marching in walker only holding on with R UE and lifting L UE to promote R UE/LE WBing and strengthening    General Comments General comments (skin integrity, edema, etc.): VSS      Pertinent Vitals/Pain Pain Assessment Pain Assessment: No/denies pain    Home Living                          Prior Function            PT Goals (current goals can now be found in the care plan section) Acute Rehab PT Goals Patient Stated Goal: home PT Goal Formulation: With patient Time For Goal Achievement: 01/02/22 Potential to Achieve Goals: Good Progress towards PT goals: Progressing toward goals    Frequency    Min 4X/week      PT Plan Current plan remains appropriate    Co-evaluation              AM-PAC PT "6 Clicks" Mobility   Outcome Measure  Help needed turning from your back to your side while in a flat bed without using bedrails?:  None Help needed moving from lying on your back to sitting on the side of a flat bed without using bedrails?: A Little Help needed moving to and from a bed to a chair (including a wheelchair)?: A Little Help needed standing up from a chair using your arms (e.g., wheelchair or bedside chair)?: A Little Help needed to walk in hospital room?: A Little Help needed climbing 3-5 steps with a railing? : A Little 6 Click Score: 19    End of Session Equipment Utilized During Treatment: Gait belt Activity Tolerance: Patient tolerated treatment well Patient left: with call bell/phone within reach;with family/visitor present;in chair (OT came into room) Nurse Communication: Mobility status PT Visit Diagnosis: Unsteadiness on feet (R26.81);Muscle weakness (generalized) (M62.81);Difficulty in walking, not elsewhere classified (R26.2)     Time: 9563-8756 PT Time Calculation (min) (ACUTE ONLY): 33 min  Charges:  $Gait Training: 8-22 mins $Therapeutic Exercise: 8-22 mins  Lewis Shock, PT, DPT Acute Rehabilitation Services Secure chat preferred Office #: [phone]    Iona Hansen 12/20/2021, 9:47 AM

## 2022-02-01 NOTE — Progress Notes (Unsigned)
[facility] [address] [place]. [place] [address] [phone]       HOSPITAL FOLLOW UP NOTE  Ms. Karla Vincent Date of Birth:  [birth year] Medical Record Number:  [id number]   Reason for Referral:  hospital stroke follow up    SUBJECTIVE:   CHIEF COMPLAINT:  No chief complaint on file.   HPI:   Karla Vincent is a 81 y.o. Caucasian female with PMH of hypertension, hyperlipidemia, diabetes who presented on 12/10/2021 with right arm weakness and difficulty with gait for 3 days.  Personally reviewed hospitalization pertinent progress notes, lab work and imaging.  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  CTA head/neck unremarkable.  EF 70 to 75%.  LDL 107.  A1c 6.6.  Recommended DAPT for 3 weeks and aspirin alone and initiate pravastatin 20 mg daily with prior history of statin intolerance.  PT/OT recommended outpatient PT/OT and discharged home in stable condition.          ROS:   14 system review of systems performed and negative with exception of ***  PMH:  Past Medical History:  Diagnosis Date   Diabetes mellitus without complication (HCC)    GERD (gastroesophageal reflux disease)    Hypertension     PSH: No past surgical history on file.  Social History:  Social History   Socioeconomic History   Marital status: Widowed    Spouse name: Not on file   Number of children: Not on file   Years of education: Not on file   Highest education level: Not on file  Occupational History   Not on file  Tobacco Use   Smoking status: Not on file   Smokeless tobacco: Not on file  Substance and Sexual Activity   Alcohol use: Not on file   Drug use: Not on file   Sexual activity: Not on file  Other Topics Concern   Not on file  Social History Narrative   Not on file   Social Determinants of Health   Financial Resource Strain: Not on file  Food Insecurity: Not on file  Transportation Needs: Not on  file  Physical Activity: Not on file  Stress: Not on file  Social Connections: Not on file  Intimate Partner Violence: Not on file    Family History: No family history on file.  Medications:   Current Outpatient Medications on File Prior to Visit  Medication Sig Dispense Refill   acetaminophen (TYLENOL) 500 MG tablet Take 1,000 mg by mouth every 6 (six) hours as needed for mild pain or headache.     amLODipine (NORVASC) 5 MG tablet Take 5 mg by mouth at bedtime.     aspirin EC 81 MG tablet Take 1 tablet (81 mg total) by mouth daily. Swallow whole. 30 tablet 0   donepezil (ARICEPT) 5 MG tablet Take 5 mg by mouth at bedtime.     famotidine (PEPCID) 40 MG tablet Take 40 mg by mouth daily.     glipiZIDE (GLUCOTROL XL) 5 MG 24 hr tablet Take 5 mg by mouth at bedtime.     LORazepam (ATIVAN) 1 MG tablet Take 1 mg by mouth at bedtime.     losartan-hydrochlorothiazide (HYZAAR) 100-25 MG tablet Take 1 tablet by mouth daily. 30 tablet 1   meloxicam (MOBIC) 7.5 MG tablet Take 7.5 mg by mouth daily.     pravastatin (PRAVACHOL) 20 MG tablet Take 1 tablet (20 mg total) by mouth daily at 6 PM.  30 tablet 0   No current facility-administered medications on file prior to visit.    Allergies:   Allergies  Allergen Reactions   Sulfa Antibiotics Itching   Codeine Nausea Only   Prednisone Nausea Only      OBJECTIVE:  Physical Exam  There were no vitals filed for this visit. There is no height or weight on file to calculate BMI. No results found.      No data to display           General: well developed, well nourished, seated, in no evident distress Head: head normocephalic and atraumatic.   Neck: supple with no carotid or supraclavicular bruits Cardiovascular: regular rate and rhythm, no murmurs Musculoskeletal: no deformity Skin:  no rash/petichiae Vascular:  Normal pulses all extremities   Neurologic Exam Mental Status: Awake and fully alert. Oriented to place and time. Recent  and remote memory intact. Attention span, concentration and fund of knowledge appropriate. Mood and affect appropriate.  Cranial Nerves: Fundoscopic exam reveals sharp disc margins. Pupils equal, briskly reactive to light. Extraocular movements full without nystagmus. Visual fields full to confrontation. Hearing intact. Facial sensation intact. Face, tongue, palate moves normally and symmetrically.  Motor: Normal bulk and tone. Normal strength in all tested extremity muscles Sensory.: intact to touch , pinprick , position and vibratory sensation.  Coordination: Rapid alternating movements normal in all extremities. Finger-to-nose and heel-to-shin performed accurately bilaterally. Gait and Station: Arises from chair without difficulty. Stance is normal. Gait demonstrates normal stride length and balance with ***. Tandem walk and heel toe ***.  Reflexes: 1+ and symmetric. Toes downgoing.     NIHSS  *** Modified Rankin  ***      ASSESSMENT: Karla Vincent is a 81 y.o. 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 Vascular risk factors include HTN, HLD, DM and advanced age.      PLAN:  Left BG/CR stroke:  Residual deficit: ***.  Continue aspirin 81 mg daily  and pravastatin for secondary stroke prevention.   Discussed secondary stroke prevention measures and importance of close PCP follow up for aggressive stroke risk factor management including BP goal<130/90, HLD with LDL goal<70 and DM with A1c.<7  Stroke labs 11/2021: LDL 107, A1c 6.6 I have gone over the pathophysiology of stroke, warning signs and symptoms, risk factors and their management in some detail with instructions to go to the closest emergency room for symptoms of concern.    Follow up in *** or call earlier if needed   CC:  [facility] provider: Dr. Pearlean Brownie PCP: Marcell Anger, NP    I spent *** minutes of face-to-face and non-face-to-face time with patient.  This included  previsit chart review including review of recent hospitalization, lab review, study review, order entry, electronic health record documentation, patient education regarding recent stroke including etiology, secondary stroke prevention measures and importance of managing stroke risk factors, residual deficits and typical recovery time and answered all other questions to patient satisfaction   Ihor Austin, AGNP-BC  [facility] [address] [place], [place] [address]  Phone [phone] Fax [phone] Note: This document was prepared with digital dictation and possible smart phrase technology. Any transcriptional errors that result from this process are unintentional.

## 2022-02-02 ENCOUNTER — Ambulatory Visit (INDEPENDENT_AMBULATORY_CARE_PROVIDER_SITE_OTHER): Payer: Medicare Other | Admitting: Adult Health

## 2022-02-02 ENCOUNTER — Encounter: Payer: Self-pay | Admitting: Adult Health

## 2022-02-02 VITALS — BP 133/72 | HR 84 | Ht 62.0 in | Wt 140.5 lb

## 2022-02-02 DIAGNOSIS — Z09 Encounter for follow-up examination after completed treatment for conditions other than malignant neoplasm: Secondary | ICD-10-CM

## 2022-02-02 DIAGNOSIS — I6381 Other cerebral infarction due to occlusion or stenosis of small artery: Secondary | ICD-10-CM | POA: Diagnosis not present

## 2022-02-02 DIAGNOSIS — G8191 Hemiplegia, unspecified affecting right dominant side: Secondary | ICD-10-CM

## 2022-02-02 NOTE — Patient Instructions (Signed)
Continue working with therapies for likely ongoing recovery - use of cane at all times unless otherwise instructed  Continue aspirin 81 mg daily  and pravastatin 20 mg daily for secondary stroke prevention  Continue to follow up with PCP regarding cholesterol, blood pressure and diabetes management  Maintain strict control of hypertension with blood pressure goal below 130/90, diabetes with hemoglobin A1c goal below 7.0 % and cholesterol with LDL cholesterol (bad cholesterol) goal below 70 mg/dL.   Signs of a Stroke? Follow the BEFAST method:  Balance Watch for a sudden loss of balance, trouble with coordination or vertigo Eyes Is there a sudden loss of vision in one or both eyes? Or double vision?  Face: Ask the person to smile. Does one side of the face droop or is it numb?  Arms: Ask the person to raise both arms. Does one arm drift downward? Is there weakness or numbness of a leg? Speech: Ask the person to repeat a simple phrase. Does the speech sound slurred/strange? Is the person confused ? Time: If you observe any of these signs, call 911.    Followup in the future with me in 3-4 months or call earlier if needed       Thank you for coming to see [place] at [facility]. I hope we have been able to provide you high quality care today.  You may receive a patient satisfaction survey over the next few weeks. We would appreciate your feedback and comments so that we may continue to improve ourselves and the health of our patients.    Facts You Should Know Arm Care After A Stroke  After a Stroke or Brain Injury, it is common for your arm to come "out of socket"  This is because the muscles that hold your arm in your shoulder joint are weakened.  Your shoulder can be easily injured if it is moved improperly.  Injury to your shoulder is serious and can be prevented.  Never let your arm hang.  This may cause pain in the shoulder.  Support it on an arm rest or on  pillows. Never let anyone pull on your arm when helping you move in bed, during bathing or dressing, during bedpan placement or transfers. Never let anyone grab under your arms to help pick you up or keep you from falling.  This may cause injury to your shoulder. If you have any pain in your shoulder when you raise your arm, or when someone moves it for you, do not raise it past that point.  Performing movements at your shoulder should be done only after instruction from an occupational therapist, to be sure that you are or your caregiver is doing it safely. Support and move your arm by holding at the elbow.      Stroke Prevention Some medical conditions and lifestyle choices can lead to a higher risk for a stroke. You can help to prevent a stroke by eating healthy foods and exercising. It also helps to not smoke and to manage any health problems you may have. How can this condition affect me? A stroke is an emergency. It should be treated right away. A stroke can lead to brain damage or threaten your life. There is a better chance of surviving and getting better after a stroke if you get medical help right away. What can increase my risk? The following medical conditions may increase your risk of a stroke: Diseases of the heart and blood vessels (cardiovascular disease). High  blood pressure (hypertension). Diabetes. High cholesterol. Sickle cell disease. Problems with blood clotting. Being very overweight. Sleeping problems (obstructivesleep apnea). Other risk factors include: Being older than age 13. A history of blood clots, stroke, or mini-stroke (TIA). Race, ethnic background, or a family history of stroke. Smoking or using tobacco products. Taking birth control pills, especially if you smoke. Heavy alcohol and drug use. Not being active. What actions can I take to prevent this? Manage your health conditions High cholesterol. Eat a healthy diet. If this is not enough to  manage your cholesterol, you may need to take medicines. Take medicines as told by your doctor. High blood pressure. Try to keep your blood pressure below 130/80. If your blood pressure cannot be managed through a healthy diet and regular exercise, you may need to take medicines. Take medicines as told by your doctor. Ask your doctor if you should check your blood pressure at home. Have your blood pressure checked every year. Diabetes. Eat a healthy diet and get regular exercise. If your blood sugar (glucose) cannot be managed through diet and exercise, you may need to take medicines. Take medicines as told by your doctor. Talk to your doctor about getting checked for sleeping problems. Signs of a problem can include: Snoring a lot. Feeling very tired. Make sure that you manage any other conditions you have. Nutrition  Follow instructions from your doctor about what to eat or drink. You may be told to: Eat and drink fewer calories each day. Limit how much salt (sodium) you use to 1,500 milligrams (mg) each day. Use only healthy fats for cooking, such as olive oil, canola oil, and sunflower oil. Eat healthy foods. To do this: Choose foods that are high in fiber. These include whole grains, and fresh fruits and vegetables. Eat at least 5 servings of fruits and vegetables a day. Try to fill one-half of your plate with fruits and vegetables at each meal. Choose low-fat (lean) proteins. These include low-fat cuts of meat, chicken without skin, fish, tofu, beans, and nuts. Eat low-fat dairy products. Avoid foods that: Are high in salt. Have saturated fat. Have trans fat. Have cholesterol. Are processed or pre-made. Count how many carbohydrates you eat and drink each day. Lifestyle If you drink alcohol: Limit how much you have to: 0-1 drink a day for women who are not pregnant. 0-2 drinks a day for men. Know how much alcohol is in your drink. In the [place] one drink equals one 12 oz  bottle of beer ( ), one 5 oz glass of wine ( ), or one 1 oz glass of hard liquor (50mL). Do not smoke or use any products that have nicotine or tobacco. If you need help quitting, ask your doctor. Avoid secondhand smoke. Do not use drugs. Activity  Try to stay at a healthy weight. Get at least 30 minutes of exercise on most days, such as: Fast walking. Biking. Swimming. Medicines Take over-the-counter and prescription medicines only as told by your doctor. Avoid taking birth control pills. Talk to your doctor about the risks of taking birth control pills if: You are over 50 years old. You smoke. You get very bad headaches. You have had a blood clot. Where to find more information American Stroke Association: www.strokeassociation.org Get help right away if: You or a loved one has any signs of a stroke. "BE FAST" is an easy way to remember the warning signs: B - Balance. Dizziness, sudden trouble walking, or loss of balance. E - Eyes. Trouble  seeing or a change in how you see. F - Face. Sudden weakness or loss of feeling of the face. The face or eyelid may droop on one side. A - Arms. Weakness or loss of feeling in an arm. This happens all of a sudden and most often on one side of the body. S - Speech. Sudden trouble speaking, slurred speech, or trouble understanding what people say. T - Time. Time to call emergency services. Write down what time symptoms started. You or a loved one has other signs of a stroke, such as: A sudden, very bad headache with no known cause. Feeling like you may vomit (nausea). Vomiting. A seizure. These symptoms may be an emergency. Get help right away. Call your local emergency services (911 in the [place] Do not wait to see if the symptoms will go away. Do not drive yourself to the hospital. Summary You can help to prevent a stroke by eating healthy, exercising, and not smoking. It also helps to manage any health problems you have. Do not  smoke or use any products that contain nicotine or tobacco. Get help right away if you or a loved one has any signs of a stroke. This information is not intended to replace advice given to you by your health care provider. Make sure you discuss any questions you have with your health care provider. Document Revised: 02/09/2020 Document Reviewed: 02/09/2020 Elsevier Patient Education  2023 [employer].

## 2022-03-08 ENCOUNTER — Telehealth: Payer: Self-pay | Admitting: Adult Health

## 2022-03-08 DIAGNOSIS — G8111 Spastic hemiplegia affecting right dominant side: Secondary | ICD-10-CM

## 2022-03-08 NOTE — Telephone Encounter (Signed)
Patients daughter came in inquiring about Botox in pts. arm due to stroke and therapist recommended. Would like to be seen for Botox before 05/18/22 Appointment.

## 2022-03-08 NOTE — Telephone Encounter (Signed)
Pt's daughter returned call and LVM. Please call back when available.

## 2022-03-08 NOTE — Telephone Encounter (Signed)
Contacted daughter back again, LVM rq call back.

## 2022-03-08 NOTE — Telephone Encounter (Addendum)
Contacted daughter back, LVM rq call back.  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

## 2022-03-09 NOTE — Telephone Encounter (Signed)
Pt's daughter returned call. Please call the pt herself at [phone]

## 2022-03-09 NOTE — Addendum Note (Signed)
Addended by: Ihor Austin L on: 03/09/2022 02:45 PM   Modules accepted: Orders

## 2022-03-09 NOTE — Telephone Encounter (Signed)
Contacted pt back, informed her during last visit she mentioned that spacticity was not painful at that time, asked if it worsened since prior visit? She stated it has worsened since last visit, had a fall recently and pain increased in arm. She is interested in receiving botox now.

## 2022-03-09 NOTE — Telephone Encounter (Signed)
Order placed to be evaluated by physical medicine and rehab by Dr. 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

## 2022-03-09 NOTE — Telephone Encounter (Signed)
Contacted pt back, informed her order was placed to be evaluated by physical medicine and rehab by Dr. 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 She as Adult nurse.

## 2022-03-13 ENCOUNTER — Encounter: Payer: Self-pay | Admitting: Physical Medicine & Rehabilitation

## 2022-05-04 NOTE — Progress Notes (Signed)
[facility] [address] [place]. [place] [address] [phone]       STROKE FOLLOW UP NOTE  Ms. ROMY IPOCK Date of Birth:  06/28/1941 Medical Record Number:  [id number]   Reason for Referral: stroke follow up    SUBJECTIVE:   CHIEF COMPLAINT:  Chief Complaint  Patient presents with   Follow-up    RM 2 with daughter Lawson Fiscal Pt is well and stable, no new concerns     HPI:   Update 05/08/2022 JM: Patient returns for stroke follow-up after prior visit 3 months ago accompanied by her daughter.  Overall stable without new stroke/TIA symptoms.  Reports residual right arm spasticity and imbalance.  Completed PT, was recently seen by Dr. Wynn Banker and is scheduled to obtain Botox for RUE spasticity on 11/21 and plans on returning back to OT at [facility] after that injection.  Continues to ambulate with a cane outdoors, denies any recent falls.  Denies any residual word finding difficulty or RLE weakness.  Denies any new stroke/TIA symptoms.  Remains on aspirin and pravastatin Blood pressure today 149/71, her other daughter monitors at home but unsure of exact levels Routinely follows with PCP with prior visit 9/27 and return visit on 12/27 with plans on obtaining fasting labs No new concerns at this time    History provided for reference purposes only Initial visit 02/02/2022 JM: Patient being seen for initial hospital follow-up accompanied by her daughter.  Overall stable without new stroke/TIA symptoms. Continued imbalance, not quite at baseline, currently using cane (was not using prior), is improving, no recent falls, working with PT at [facility]. Continues right arm > leg weakness, has been gradually improving, working with OT. Occasional word finding difficulty, has been improving. Initially difficulty swallowing but this is since resolved.  Completed SLP.  Lives with daughter, was living in own home prior with son. Goal is to return back to her  own home.  She was completely independent prior.  Maintaining all ADLs independently and has been gradually trying to do some IADLs.   Completed 3 weeks DAPT, remains on aspirin alone as well as pravastatin, denies side effects.  Blood pressure today 133/72. Monitored at home and typically stable. She has since had follow-up with PCP Claris Gladden, NP - recent A1c 5.8 - has been modifying diet and was switched from metformin to glipizide.   No further concerns at this time  Stroke admission 12/10/2021 Margarit Minshall [place] is a 81 y.o. Caucasian female with PMH of hypertension, hyperlipidemia, diabetes who presented on 12/10/2021 with right arm weakness and difficulty with gait for 3 days.  Personally reviewed hospitalization pertinent progress notes, lab work and imaging.  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  CTA head/neck unremarkable.  EF 70 to 75%.  LDL 107.  A1c 6.6.  Recommended DAPT for 3 weeks and aspirin alone and initiate pravastatin 20 mg daily with prior history of statin intolerance.  PT/OT recommended outpatient PT/OT and discharged home in stable condition.      ROS:   14 system review of systems performed and negative with exception of listed in HPI  PMH:  Past Medical History:  Diagnosis Date   Diabetes mellitus without complication (HCC)    GERD (gastroesophageal reflux disease)    Hypertension     PSH: History reviewed. No pertinent surgical history.  Social History:  Social History   Socioeconomic History   Marital status: Widowed    Spouse name:  Not on file   Number of children: Not on file   Years of education: Not on file   Highest education level: Not on file  Occupational History   Not on file  Tobacco Use   Smoking status: Never   Smokeless tobacco: Never  Vaping Use   Vaping Use: Never used  Substance and Sexual Activity   Alcohol use: Not Currently   Drug use: Never   Sexual activity: Not on file  Other  Topics Concern   Not on file  Social History Narrative   Not on file   Social Determinants of Health   Financial Resource Strain: Not on file  Food Insecurity: Not on file  Transportation Needs: Not on file  Physical Activity: Not on file  Stress: Not on file  Social Connections: Not on file  Intimate Partner Violence: Not on file    Family History: History reviewed. No pertinent family history.  Medications:   Current Outpatient Medications on File Prior to Visit  Medication Sig Dispense Refill   acetaminophen (TYLENOL) 500 MG tablet Take 1,000 mg by mouth every 6 (six) hours as needed for mild pain or headache.     amLODipine (NORVASC) 5 MG tablet Take 1 tablet by mouth daily.     aspirin EC 81 MG tablet Take 1 tablet by mouth daily.     donepezil (ARICEPT) 5 MG tablet Take by mouth.     famotidine (PEPCID) 40 MG tablet Take by mouth.     gabapentin (NEURONTIN) 300 MG capsule Take by mouth.     glipiZIDE (GLUCOTROL XL) 5 MG 24 hr tablet Take 5 mg by mouth at bedtime.     LORazepam (ATIVAN) 1 MG tablet Take by mouth.     losartan-hydrochlorothiazide (HYZAAR) 100-25 MG tablet Take 1 tablet by mouth daily. 30 tablet 1   meloxicam (MOBIC) 7.5 MG tablet Take 1 tablet by mouth daily.     mirtazapine (REMERON) 15 MG tablet Take 15 mg by mouth at bedtime.     potassium chloride (KLOR-CON) 10 MEQ tablet Take 10 mEq by mouth daily.     pravastatin (PRAVACHOL) 20 MG tablet Take 1 tablet by mouth daily.     No current facility-administered medications on file prior to visit.    Allergies:   Allergies  Allergen Reactions   Sulfa Antibiotics Itching   Codeine Nausea Only   Prednisone Nausea Only      OBJECTIVE:  Physical Exam  Vitals:   05/08/22 0830  BP: (!) 149/71  Pulse: 72  Weight: 134 lb (60.8 kg)  Height: 5\' 2"  (1.575 m)   Body mass index is 24.51 kg/m. No results found.  General: well developed, well nourished, very pleasant elderly Caucasian female, seated,  in no evident distress Head: head normocephalic and atraumatic.   Neck: supple with no carotid or supraclavicular bruits Cardiovascular: regular rate and rhythm, no murmurs Musculoskeletal: no deformity Skin:  no rash/petichiae Vascular:  Normal pulses all extremities   Neurologic Exam Mental Status: Awake and fully alert.  Fluent speech and language.  Follows commands without difficulty.  Oriented to place and time. Recent and remote memory intact. Attention span, concentration and fund of knowledge appropriate. Mood and affect appropriate.  Cranial Nerves:  Pupils equal, briskly reactive to light. Extraocular movements full without nystagmus. Visual fields full to confrontation. Hearing intact. Facial sensation intact.  Face, tongue, palate moves normally and symmetrically.  Motor: Normal strength, bulk and tone left upper and lower extremity RUE: 3/5  deltoid, 4/5 elbow flexion and extension, difficult making fist, increased tone throughout and limited shoulder and finger ROM RLE: 4+-5-/5 Sensory.: intact to touch , pinprick , position and vibratory sensation.  Coordination: Rapid alternating movements normal in all extremities except right hand.. Finger-to-nose performed accurately LUE and heel-to-shin performed accurately bilaterally. Gait and Station: Arises from chair with mild difficulty. Stance is slightly hunched. Gait demonstrates slightly decreased stride length and step height R>L with mild unsteadiness and use of cane.  Walking heel toe not attempted Reflexes: 3+ RUE, 2+ LUE, 1+ BLE. Toes downgoing.        ASSESSMENT: DAFINA SUK is a 81 y.o. 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 Vascular risk factors include HTN, HLD, DM and advanced age.      PLAN:  Left BG/CR stroke:  Residual deficit: Right hemiparesis with RUE spasticity and gait impairment.  Continue working with OT.  Follow-up with Dr. Letta Pate as  scheduled next month for initial Botox Continue aspirin 81 mg daily  and pravastatin for secondary stroke prevention.   Discussed secondary stroke prevention measures and importance of close PCP follow up for aggressive stroke risk factor management including BP goal<130/90, HLD with LDL goal<70 and DM with A1c.<7  Stroke labs: LDL 107 (11/2021), A1c 5.9 (03/2022) - plans on fasting labs (? Including lipid panel) at f/u visit with PCP in December  I have gone over the pathophysiology of stroke, warning signs and symptoms, risk factors and their management in some detail with instructions to go to the closest emergency room for symptoms of concern.    Doing well from stroke standpoint without further recommendations and risk factors are managed by PCP. She may follow up PRN, as usual for our patients who are strictly being followed for stroke. If any new neurological issues should arise, request PCP place referral for evaluation by one of our neurologists. Thank you.      CC:  PCP: Gennette Pac, NP    I spent 32 minutes of face-to-face and non-face-to-face time with patient and daughter.  This included previsit chart review, lab review, study review, electronic health record documentation, patient and daughter education regarding prior stroke including etiology and residual deficits, secondary stroke prevention measures and importance of managing stroke risk factors, and answered all other questions to patient and daughters satisfaction   Frann Rider, [facility]  [facility] [address] [place], [place] [address]  Phone [phone] Fax [phone] Note: This document was prepared with digital dictation and possible smart phrase technology. Any transcriptional errors that result from this process are unintentional.

## 2022-05-05 ENCOUNTER — Encounter: Payer: Medicare Other | Attending: Physical Medicine & Rehabilitation | Admitting: Physical Medicine & Rehabilitation

## 2022-05-05 ENCOUNTER — Encounter: Payer: Self-pay | Admitting: Physical Medicine & Rehabilitation

## 2022-05-05 VITALS — BP 153/75 | HR 82 | Ht 62.0 in | Wt 134.0 lb

## 2022-05-05 DIAGNOSIS — G8111 Spastic hemiplegia affecting right dominant side: Secondary | ICD-10-CM | POA: Diagnosis present

## 2022-05-05 NOTE — Progress Notes (Signed)
Subjective:    Patient ID: Karla Vincent, female    DOB: 1940/12/14, 81 y.o.   MRN: [id number] Per [facility] note 02/02/22 81 y.o. Caucasian female with PMH of hypertension, hyperlipidemia, diabetes who presented on 12/10/2021 with right arm weakness and difficulty with gait for 3 days.  Personally reviewed hospitalization pertinent progress notes, lab work and imaging.  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  CTA head/neck unremarkable.  EF 70 to 75%.  LDL 107.  A1c 6.6.  Recommended DAPT for 3 weeks and aspirin alone and initiate pravastatin 20 mg daily with prior history of statin intolerance.  PT/OT recommended outpatient PT/OT and discharged home in stable condition. HPI  Cc:  RUE spasticity  81 year old female with recent left subcortical infarct causing right upper extremity greater than lower extremity weakness.  She received outpatient therapy with PT OT at [facility].  She has finished this.  She is ambulatory but has difficulty using right upper extremity due to weakness.  She is able to climb steps.  She no longer drives. She notes right upper extremity is getting progressively stiffer this occurs mainly in the evening.  The patient states that her hand balls up into a fist and that it is difficult to get her elbow straight.  She does not note any significant spasticity in lower extremity occasionally she has her right foot shaking but this is generally not an issue.  She has no difficulty during ambulation. Pt received outpt re  Pain Inventory Average Pain 5 Pain Right Now 4 My pain is intermittent, tingling, and aching  LOCATION OF PAIN  Pain on the right side: shoulder, elbow,wrist, hand, fingers  BOWEL Number of stools per week: 7    BLADDER Normal and Pads    Mobility use a cane how many minutes can you walk? 60 min ability to climb steps?  yes do you drive?  no Do you have any goals in this area?   yes  Function retired  Neuro/Psych weakness dizziness confusion depression anxiety  Prior Studies Any changes since last visit?  no  Physicians involved in your care Any changes since last visit?  no   No family history on file. Social History   Socioeconomic History   Marital status: Widowed    Spouse name: Not on file   Number of children: Not on file   Years of education: Not on file   Highest education level: Not on file  Occupational History   Not on file  Tobacco Use   Smoking status: Never   Smokeless tobacco: Never  Vaping Use   Vaping Use: Never used  Substance and Sexual Activity   Alcohol use: Not Currently   Drug use: Never   Sexual activity: Not on file  Other Topics Concern   Not on file  Social History Narrative   Not on file   Social Determinants of Health   Financial Resource Strain: Not on file  Food Insecurity: Not on file  Transportation Needs: Not on file  Physical Activity: Not on file  Stress: Not on file  Social Connections: Not on file   History reviewed. No pertinent surgical history. Past Medical History:  Diagnosis Date   Diabetes mellitus without complication (HCC)    GERD (gastroesophageal reflux disease)    Hypertension    BP (!) 153/75   Pulse 82   Ht 5\' 2"  (1.575 m)   Wt 134 lb (60.8 kg)   SpO2 97%  BMI 24.51 kg/m   Opioid Risk Score:   Fall Risk Score:  `1  Depression screen [facility] 2/9     05/05/2022   12:18 PM  Depression screen PHQ 2/9  Decreased Interest 0  Down, Depressed, Hopeless 1  PHQ - 2 Score 1  Altered sleeping 0  Tired, decreased energy 1  Change in appetite 0  Feeling bad or failure about yourself  1  Trouble concentrating 0  Moving slowly or fidgety/restless 1  Suicidal thoughts 0  PHQ-9 Score 4    Review of Systems  Musculoskeletal:  Positive for gait problem.  Neurological:  Positive for dizziness and weakness.  Psychiatric/Behavioral:  Positive for confusion.        Anxiety        Objective:   Physical Exam Vitals and nursing note reviewed.  Constitutional:      Appearance: Normal appearance.  HENT:     Head: Normocephalic and atraumatic.  Eyes:     General: No visual field deficit.    Extraocular Movements: Extraocular movements intact.     Conjunctiva/sclera: Conjunctivae normal.     Pupils: Pupils are equal, round, and reactive to light.  Musculoskeletal:     Comments: There is no evidence of contracture of the finger flexors or wrist flexors or elbow flexors.  Skin:    General: Skin is warm and dry.  Neurological:     Mental Status: She is alert and oriented to person, place, and time.     Cranial Nerves: No dysarthria or facial asymmetry.     Sensory: Sensation is intact.     Motor: Weakness and abnormal muscle tone present.     Coordination: Coordination abnormal. Impaired rapid alternating movements.     Gait: Gait abnormal.     Comments: Motor strength in the left upper and left lower limb are normal Right upper extremity strength Right deltoid 3 -, bicep 2/5 tricep trace finger flexors to minus finger extensors 0 wrist flexors trace wrist extensors 0 Right lower extremity 4/5 in hip flexor knee extensors 3/5 and ankle dorsiflexor Ambulates with a cane but can also go short distances without a cane.  She has some tendency towards foot drop but is able to clear the toes. Tone MAS 3 at the right elbow flexors biceps brachialis tight MAS 3 at the wrist flexors MAS 2 at the finger flexors  Psychiatric:        Mood and Affect: Mood normal.        Behavior: Behavior normal.           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 Given her age would avoid oral antispasticity medications, would recommend Botox or Xeomin We discussed the usual onset of action  1 to 2 weeks and the normal duration of 3 months Would plan the following muscle injections Right biceps 50 units Right brachialis 50 units Right FCR 50 units Right FDP 25 units Right FDS 25 units Discussed with patient and her daughter they elect proceed we will schedule next available

## 2022-05-08 ENCOUNTER — Encounter: Payer: Self-pay | Admitting: Adult Health

## 2022-05-08 ENCOUNTER — Ambulatory Visit (INDEPENDENT_AMBULATORY_CARE_PROVIDER_SITE_OTHER): Payer: Medicare Other | Admitting: Adult Health

## 2022-05-08 VITALS — BP 149/71 | HR 72 | Ht 62.0 in | Wt 134.0 lb

## 2022-05-08 DIAGNOSIS — G8111 Spastic hemiplegia affecting right dominant side: Secondary | ICD-10-CM | POA: Diagnosis not present

## 2022-05-08 DIAGNOSIS — I6381 Other cerebral infarction due to occlusion or stenosis of small artery: Secondary | ICD-10-CM | POA: Diagnosis not present

## 2022-05-08 NOTE — Patient Instructions (Signed)
Continue working with OT and follow up with Dr. Letta Pate for botox injections as scheduled   Continue aspirin 81 mg daily  and pravastatin  for secondary stroke prevention  Continue to follow up with PCP regarding cholesterol, diabetes and blood pressure management  Maintain strict control of hypertension with blood pressure goal below 130/90, diabetes with hemoglobin A1c goal below 7.0 % and cholesterol with LDL cholesterol (bad cholesterol) goal below 70 mg/dL.   Signs of a Stroke? Follow the BEFAST method:  Balance Watch for a sudden loss of balance, trouble with coordination or vertigo Eyes Is there a sudden loss of vision in one or both eyes? Or double vision?  Face: Ask the person to smile. Does one side of the face droop or is it numb?  Arms: Ask the person to raise both arms. Does one arm drift downward? Is there weakness or numbness of a leg? Speech: Ask the person to repeat a simple phrase. Does the speech sound slurred/strange? Is the person confused ? Time: If you observe any of these signs, call 911.      Thank you for coming to see [place] at [facility]. I hope we have been able to provide you high quality care today.  You may receive a patient satisfaction survey over the next few weeks. We would appreciate your feedback and comments so that we may continue to improve ourselves and the health of our patients.

## 2022-06-13 ENCOUNTER — Encounter: Payer: Self-pay | Admitting: Physical Medicine & Rehabilitation

## 2022-06-13 ENCOUNTER — Encounter: Payer: Medicare Other | Attending: Physical Medicine & Rehabilitation | Admitting: Physical Medicine & Rehabilitation

## 2022-06-13 VITALS — BP 156/73 | HR 78 | Ht 62.0 in | Wt 131.0 lb

## 2022-06-13 DIAGNOSIS — G8111 Spastic hemiplegia affecting right dominant side: Secondary | ICD-10-CM | POA: Insufficient documentation

## 2022-06-13 MED ORDER — INCOBOTULINUMTOXINA 100 UNITS IM SOLR
200.0000 [IU] | Freq: Once | INTRAMUSCULAR | Status: AC
  Administered 2022-06-13: 200 [IU] via INTRAMUSCULAR

## 2022-06-13 MED ORDER — SODIUM CHLORIDE (PF) 0.9 % IJ SOLN: 4.0000 mL | Freq: Once | INTRAMUSCULAR | Status: DC

## 2022-06-13 MED ORDER — SODIUM CHLORIDE (PF) 0.9 % IJ SOLN
4.0000 mL | Freq: Once | INTRAMUSCULAR | Status: AC
  Administered 2022-06-13: 4 mL

## 2022-06-13 NOTE — Patient Instructions (Signed)
You received a Xeomin injection today. You may experience soreness at the needle injection sites. Please call us if any of the injection sites turns red after a couple days or if there is any drainage. You may experience muscle weakness as a result of Xeomin This would improve with time but can take several weeks to improve. The Xeomin should start working in about one week. The Xeomin usually last 3 months. The injection can be repeated every 3 months as needed.  

## 2022-06-13 NOTE — Addendum Note (Signed)
Addended by: Doreene Eland on: 06/13/2022 02:29 PM   Modules accepted: Orders

## 2022-06-13 NOTE — Progress Notes (Signed)
xeomin Injection for spasticity using needle EMG guidance  Dilution: 50 Units/ml Indication: Severe spasticity which interferes with ADL,mobility and/or  hygiene and is unresponsive to medication management and other conservative care Informed consent was obtained after describing risks and benefits of the procedure with the patient. This includes bleeding, bruising, infection, excessive weakness, or medication side effects. A REMS form is on file and signed. Needle: 27u 1"  needle electrode Number of units per muscle Right biceps 25 units Right brachialis 50 units Right Brachioradialis 25 units Right FCR 50 units Right FDP 25 units Right FDS 25 units All injections were done after obtaining appropriate EMG activity and after negative drawback for blood. The patient tolerated the procedure well. Post procedure instructions were given. A followup appointment was made.   

## 2022-07-27 ENCOUNTER — Encounter: Payer: 59 | Attending: Physical Medicine & Rehabilitation | Admitting: Physical Medicine & Rehabilitation

## 2022-07-27 ENCOUNTER — Encounter: Payer: Self-pay | Admitting: Physical Medicine & Rehabilitation

## 2022-07-27 VITALS — BP 145/76 | HR 66 | Ht 62.0 in | Wt 130.0 lb

## 2022-07-27 DIAGNOSIS — G8111 Spastic hemiplegia affecting right dominant side: Secondary | ICD-10-CM | POA: Diagnosis present

## 2022-07-27 NOTE — Progress Notes (Signed)
Subjective:    Patient ID: Karla Vincent, female    DOB: [birth year], 82 y.o.   MRN: [id number]  HPI 82 year old female with right spastic hemiplegia due to left CVA.  The patient has undergone botulinum toxin injection approximately 6 weeks ago and is here to evaluate the effects.  The patient has recently restarted and Occupational Therapy.  She had no adverse effects from the medication injection.  Interval medical history is otherwise unremarkable Xeomin injection 06/13/22 Right biceps 25 units Right brachialis 50 units Right Brachioradialis 25 units Right FCR 50 units Right FDP 25 units Right FDS 25 units  Pain Inventory Average Pain 5 Pain Right Now 5 My pain is intermittent and dull  LOCATION OF PAIN  wrist, hand, back  BOWEL Number of stools per week: 7  BLADDER Normal  Mobility use a cane how many minutes can you walk? 30 ability to climb steps?  yes do you drive?  no Do you have any goals in this area?  yes  Function retired I need assistance with the following:  meal prep, household duties, and shopping Do you have any goals in this area?  yes  Neuro/Psych anxiety  Prior Studies Any changes since last visit?  no  Physicians involved in your care Any changes since last visit?  no   No family history on file. Social History   Socioeconomic History   Marital status: Widowed    Spouse name: Not on file   Number of children: Not on file   Years of education: Not on file   Highest education level: Not on file  Occupational History   Not on file  Tobacco Use   Smoking status: Never   Smokeless tobacco: Never  Vaping Use   Vaping Use: Never used  Substance and Sexual Activity   Alcohol use: Not Currently   Drug use: Never   Sexual activity: Not on file  Other Topics Concern   Not on file  Social History Narrative   Not on file   Social Determinants of Health   Financial Resource Strain: Not on file  Food Insecurity: Not on file   Transportation Needs: Not on file  Physical Activity: Not on file  Stress: Not on file  Social Connections: Not on file   History reviewed. No pertinent surgical history. Past Medical History:  Diagnosis Date   Diabetes mellitus without complication (HCC)    GERD (gastroesophageal reflux disease)    Hypertension    BP (!) 145/76   Pulse 66   Ht 5\' 2"  (1.575 m)   Wt 130 lb (59 kg)   SpO2 98%   BMI 23.78 kg/m   Opioid Risk Score:   Fall Risk Score:  `1  Depression screen [facility] 2/9     07/27/2022   11:55 AM 06/13/2022    1:20 PM 05/05/2022   12:18 PM  Depression screen PHQ 2/9  Decreased Interest 0 1 0  Down, Depressed, Hopeless 0 1 1  PHQ - 2 Score 0 2 1  Altered sleeping   0  Tired, decreased energy   1  Change in appetite   0  Feeling bad or failure about yourself    1  Trouble concentrating   0  Moving slowly or fidgety/restless   1  Suicidal thoughts   0  PHQ-9 Score   4    Review of Systems  Musculoskeletal:  Positive for gait problem.  Psychiatric/Behavioral:         Anxiety  All other systems reviewed and are negative.      Objective:   Physical Exam  General no acute distress Mood and affect are appropriate Extremities without edema Neurologic Tone Right upper extremity Elbow flexors MAS 2 Wrist flexor MAS 3 Finger flexors MAS 1 Thumb flexor MAS 1 Motor strength is 3 - at the right deltoid to minus bicep tricep finger flexors Right lower extremity 4/5 in the hip flexor knee extensor ankle dorsiflexor Ambulates without assistive device no evidence of toe drag or knee stability no excess tone in the right lower extremity.      Assessment & Plan:  #1.  Right spastic hemiplegia status post left basal ganglia infarct.  Overall doing well functionally.  She has had problems with right upper extremity spasticity and has had good results with the Xeomin injection.  We discussed the usual duration of effect as well as need for reinjection. We  discussed that the prior injection resulted in expected improvements.  Could consider increasing dose to wrist flexors however we will hold off on this at this time and focus more on her OT. Right biceps 25 units Right brachialis 50 units Right Brachioradialis 25 units Right FCR 50 units Right FDP 25 units Right FDS 25 units

## 2022-09-14 ENCOUNTER — Encounter: Payer: 59 | Attending: Physical Medicine & Rehabilitation | Admitting: Physical Medicine & Rehabilitation

## 2022-09-14 ENCOUNTER — Encounter: Payer: Self-pay | Admitting: Physical Medicine & Rehabilitation

## 2022-09-14 VITALS — BP 188/69 | HR 73 | Ht 62.0 in | Wt 130.4 lb

## 2022-09-14 DIAGNOSIS — G8111 Spastic hemiplegia affecting right dominant side: Secondary | ICD-10-CM | POA: Diagnosis present

## 2022-09-14 MED ORDER — INCOBOTULINUMTOXINA 200 UNITS IM SOLR
200.0000 [IU] | Freq: Once | INTRAMUSCULAR | Status: AC
  Administered 2022-09-14: 200 [IU] via INTRAMUSCULAR

## 2022-09-14 NOTE — Progress Notes (Signed)
xeomin Injection for spasticity using needle EMG guidance  Dilution: 50 Units/ml Indication: Severe spasticity which interferes with ADL,mobility and/or  hygiene and is unresponsive to medication management and other conservative care Informed consent was obtained after describing risks and benefits of the procedure with the patient. This includes bleeding, bruising, infection, excessive weakness, or medication side effects. A REMS form is on file and signed. Needle: 27u 1"  needle electrode Number of units per muscle Right biceps 25 units Right brachialis 50 units Right Brachioradialis 25 units Right FCR 50 units Right FDP 25 units Right FDS 25 units All injections were done after obtaining appropriate EMG activity and after negative drawback for blood. The patient tolerated the procedure well. Post procedure instructions were given. A followup appointment was made.

## 2022-09-14 NOTE — Patient Instructions (Signed)

## 2022-10-26 ENCOUNTER — Encounter: Payer: 59 | Attending: Physical Medicine & Rehabilitation | Admitting: Physical Medicine & Rehabilitation

## 2022-10-26 ENCOUNTER — Encounter: Payer: Self-pay | Admitting: Physical Medicine & Rehabilitation

## 2022-10-26 VITALS — BP 179/71 | HR 71 | Ht 62.0 in | Wt 127.0 lb

## 2022-10-26 DIAGNOSIS — G8111 Spastic hemiplegia affecting right dominant side: Secondary | ICD-10-CM | POA: Diagnosis present

## 2022-10-26 NOTE — Progress Notes (Signed)
Subjective:    Patient ID: Karla Vincent, female    DOB: 1941-06-18, 82 y.o.   MRN: [id number] 82 y.o. Caucasian female with PMH of hypertension, hyperlipidemia, diabetes who presented on 12/10/2021 with right arm weakness and difficulty with gait for 3 days.  Personally reviewed hospitalization pertinent progress notes, lab work and imaging.  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  CTA head/neck unremarkable.  EF 70 to 75%.  LDL 107.  A1c 6.6.  Recommended DAPT for 3 weeks and aspirin alone and initiate pravastatin 20 mg daily with prior history of statin intolerance.  PT/OT recommended outpatient PT/OT and discharged home in stable condition. HPI  Patient returns for spasticity reassessment right upper limb.  6 weeks ago she underwent botulinum toxin injection.  She had previously failed more conservative care including PT OT and medication management.  The patient feels like the injection was helpful in relieving her spasticity in the right upper extremity.  She used to walk with her elbow more bent but this is improved after starting the injections.  In addition the patient feels like her fingers are relaxed.  She is using a resting hand splint during the day. 09/14/22 Xeomin inj Right biceps 25 units Right brachialis 50 units Right Brachioradialis 25 units Right FCR 50 units Right FDP 25 units Right FDS 25 units  Xeomin injection 06/13/22 Right biceps 25 units Right brachialis 50 units Right Brachioradialis 25 units Right FCR 50 units Right FDP 25 units Right FDS 25 units Pain Inventory Average Pain 7 Pain Right Now 7 My pain is sharp, dull, and aching  In the last 24 hours, has pain interfered with the following? General activity 0 Relation with others 0 Enjoyment of life 0 What TIME of day is your pain at its worst? morning  Sleep (in general) Fair  Pain is worse with: walking Pain improves with: rest Relief from Meds: 1  No  family history on file. Social History   Socioeconomic History   Marital status: Widowed    Spouse name: Not on file   Number of children: Not on file   Years of education: Not on file   Highest education level: Not on file  Occupational History   Not on file  Tobacco Use   Smoking status: Never   Smokeless tobacco: Never  Vaping Use   Vaping Use: Never used  Substance and Sexual Activity   Alcohol use: Not Currently   Drug use: Never   Sexual activity: Not on file  Other Topics Concern   Not on file  Social History Narrative   Not on file   Social Determinants of Health   Financial Resource Strain: Not on file  Food Insecurity: Not on file  Transportation Needs: Not on file  Physical Activity: Not on file  Stress: Not on file  Social Connections: Not on file   No past surgical history on file. No past surgical history on file. Past Medical History:  Diagnosis Date   Diabetes mellitus without complication (HCC)    GERD (gastroesophageal reflux disease)    Hypertension    There were no vitals taken for this visit.  Opioid Risk Score:   Fall Risk Score:  `1  Depression screen [facility] 2/9     07/27/2022   11:55 AM 06/13/2022    1:20 PM 05/05/2022   12:18 PM  Depression screen PHQ 2/9  Decreased Interest 0 1 0  Down, Depressed, Hopeless 0 1 1  PHQ - 2 Score 0 2 1  Altered sleeping   0  Tired, decreased energy   1  Change in appetite   0  Feeling bad or failure about yourself    1  Trouble concentrating   0  Moving slowly or fidgety/restless   1  Suicidal thoughts   0  PHQ-9 Score   4     Review of Systems  Musculoskeletal:  Positive for back pain.  All other systems reviewed and are negative.      Objective:   Physical Exam  Tone Right upper extremity finger flexors MAS 1 Thumb flexor MAS 2/3 Elbow flexor MAS 3 Wrist flexor MAS 2 Motor strength is 2 - at the deltoid bicep tricep finger flexors and extensors. Mood and affect are  appropriate Ambulates without physical assistance. Speech without dysarthria or aphasia      Assessment & Plan:  #1.  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  She still has some thumb flexor spasticity as well as elbow flexor spasticity that may require additional toxin however the patient states she is quite satisfied with the results of her injection thus far therefore we will keep same dosing as well as muscle group selection Return in 6 weeks for repeat injection Discussed with patient and daughter agree with plan.

## 2022-12-15 ENCOUNTER — Encounter: Payer: 59 | Attending: Physical Medicine & Rehabilitation | Admitting: Physical Medicine & Rehabilitation

## 2022-12-15 ENCOUNTER — Encounter: Payer: Self-pay | Admitting: Physical Medicine & Rehabilitation

## 2022-12-15 VITALS — BP 169/80 | HR 73 | Temp 97.9°F | Wt 127.0 lb

## 2022-12-15 DIAGNOSIS — G8111 Spastic hemiplegia affecting right dominant side: Secondary | ICD-10-CM | POA: Insufficient documentation

## 2022-12-15 MED ORDER — INCOBOTULINUMTOXINA 200 UNITS IM SOLR
200.0000 [IU] | Freq: Once | INTRAMUSCULAR | Status: AC
  Administered 2022-12-15: 200 [IU] via INTRAMUSCULAR

## 2022-12-15 MED ORDER — INCOBOTULINUMTOXINA 100 UNITS IM SOLR: 200.0000 [IU] | Freq: Once | INTRAMUSCULAR | Status: DC

## 2022-12-15 MED ORDER — SODIUM CHLORIDE (PF) 0.9 % IJ SOLN
4.0000 mL | INTRAMUSCULAR | Status: AC | PRN
Start: 2022-12-15 — End: ?
  Administered 2023-12-27: 4 mL

## 2022-12-15 NOTE — Progress Notes (Signed)
xeomin Injection for spasticity using needle EMG guidance  Dilution: 50 Units/ml Indication: Severe spasticity which interferes with ADL,mobility and/or  hygiene and is unresponsive to medication management and other conservative care Informed consent was obtained after describing risks and benefits of the procedure with the patient. This includes bleeding, bruising, infection, excessive weakness, or medication side effects. A REMS form is on file and signed. Needle: 27u 1"  needle electrode Number of units per muscle  Right brachialis 50 units Right Brachioradialis 50 units Right FCR 25 units Right FDP 25 units FPL 25 units Right FDS 25 units All injections were done after obtaining appropriate EMG activity and after negative drawback for blood. The patient tolerated the procedure well. Post procedure instructions were given. A followup appointment was made.

## 2022-12-15 NOTE — Patient Instructions (Signed)
You received a Xeomin injection today. You may experience soreness at the needle injection sites. Please call us if any of the injection sites turns red after a couple days or if there is any drainage. You may experience muscle weakness as a result of Xeomin This would improve with time but can take several weeks to improve. The Xeomin should start working in about one week. The Xeomin usually last 3 months. The injection can be repeated every 3 months as needed.  

## 2022-12-15 NOTE — Addendum Note (Signed)
Addended by: Becky Sax on: 12/15/2022 11:53 AM   Modules accepted: Orders

## 2023-03-20 ENCOUNTER — Ambulatory Visit: Payer: 59 | Admitting: Physical Medicine & Rehabilitation

## 2023-03-23 ENCOUNTER — Encounter: Payer: Self-pay | Admitting: Physical Medicine & Rehabilitation

## 2023-03-23 ENCOUNTER — Encounter: Payer: 59 | Admitting: Physical Medicine & Rehabilitation

## 2023-03-23 VITALS — BP 182/91 | HR 84 | Ht 62.0 in | Wt 129.0 lb

## 2023-03-23 DIAGNOSIS — G8111 Spastic hemiplegia affecting right dominant side: Secondary | ICD-10-CM | POA: Insufficient documentation

## 2023-03-23 MED ORDER — INCOBOTULINUMTOXINA 100 UNITS IM SOLR
200.0000 [IU] | Freq: Once | INTRAMUSCULAR | Status: AC
Start: 2023-03-23 — End: 2023-03-23
  Administered 2023-03-23: 200 [IU] via INTRAMUSCULAR

## 2023-03-23 NOTE — Progress Notes (Signed)
xeomin Injection for spasticity using needle EMG guidance  Dilution: 50 Units/ml Indication: Severe spasticity which interferes with ADL,mobility and/or  hygiene and is unresponsive to medication management and other conservative care Informed consent was obtained after describing risks and benefits of the procedure with the patient. This includes bleeding, bruising, infection, excessive weakness, or medication side effects. A REMS form is on file and signed. Needle: 27u 1"  needle electrode Number of units per muscle  Right brachialis 50 units Right Brachioradialis 50 units Right FCR 25 units Right FDP 25 units FPL 25 units Right FDS 25 units All injections were done after obtaining appropriate EMG activity and after negative drawback for blood. The patient tolerated the procedure well. Post procedure instructions were given. A followup appointment was made.

## 2023-03-23 NOTE — Patient Instructions (Signed)
You received a Xeomin injection today. You may experience soreness at the needle injection sites. Please call us if any of the injection sites turns red after a couple days or if there is any drainage. You may experience muscle weakness as a result of Xeomin This would improve with time but can take several weeks to improve. The Xeomin should start working in about one week. The Xeomin usually last 3 months. The injection can be repeated every 3 months as needed.  

## 2023-06-26 ENCOUNTER — Encounter: Payer: 59 | Attending: Physical Medicine & Rehabilitation | Admitting: Physical Medicine & Rehabilitation

## 2023-06-26 ENCOUNTER — Encounter: Payer: Self-pay | Admitting: Physical Medicine & Rehabilitation

## 2023-06-26 VITALS — BP 165/80 | HR 72 | Ht 62.0 in | Wt 132.0 lb

## 2023-06-26 DIAGNOSIS — G8111 Spastic hemiplegia affecting right dominant side: Secondary | ICD-10-CM | POA: Insufficient documentation

## 2023-06-26 MED ORDER — INCOBOTULINUMTOXINA 100 UNITS IM SOLR
200.0000 [IU] | Freq: Once | INTRAMUSCULAR | Status: AC
  Administered 2023-06-27: 200 [IU] via INTRAMUSCULAR

## 2023-06-26 NOTE — Progress Notes (Signed)
xeomin Injection for spasticity using needle EMG guidance  Dilution: 50 Units/ml Indication: Severe spasticity which interferes with ADL,mobility and/or  hygiene and is unresponsive to medication management and other conservative care Informed consent was obtained after describing risks and benefits of the procedure with the patient. This includes bleeding, bruising, infection, excessive weakness, or medication side effects. A REMS form is on file and signed. Needle: 27u 1"  needle electrode Number of units per muscle  Right brachialis 50 units Right Brachioradialis 50 units Right FCR 50 units Right FDP 25 units  Right FDS 25 units All injections were done after obtaining appropriate EMG activity and after negative drawback for blood. The patient tolerated the procedure well. Post procedure instructions were given. A followup appointment was made.

## 2023-09-25 ENCOUNTER — Encounter: Payer: 59 | Attending: Physical Medicine & Rehabilitation | Admitting: Physical Medicine & Rehabilitation

## 2023-09-25 ENCOUNTER — Encounter: Payer: Self-pay | Admitting: Physical Medicine & Rehabilitation

## 2023-09-25 VITALS — BP 167/77 | HR 61 | Ht 62.0 in | Wt 135.0 lb

## 2023-09-25 DIAGNOSIS — G8111 Spastic hemiplegia affecting right dominant side: Secondary | ICD-10-CM | POA: Diagnosis not present

## 2023-09-25 MED ORDER — INCOBOTULINUMTOXINA 100 UNITS IM SOLR
100.0000 [IU] | Freq: Once | INTRAMUSCULAR | Status: AC
  Administered 2023-09-25: 200 [IU] via INTRAMUSCULAR

## 2023-09-25 MED ORDER — SODIUM CHLORIDE (PF) 0.9 % IJ SOLN
4.0000 mL | Freq: Once | INTRAMUSCULAR | Status: AC
  Administered 2023-09-25: 4 mL via INTRAVENOUS

## 2023-09-25 NOTE — Patient Instructions (Signed)
 You received a Xeomin injection today. You may experience soreness at the needle injection sites. Please call us if any of the injection sites turns red after a couple days or if there is any drainage. You may experience muscle weakness as a result of Xeomin This would improve with time but can take several weeks to improve. The Xeomin should start working in about one week. The Xeomin usually last 3 months. The injection can be repeated every 3 months as needed.

## 2023-09-25 NOTE — Progress Notes (Signed)
 xeomin Injection for spasticity using needle EMG guidance  Dilution: 50 Units/ml Indication: Severe spasticity which interferes with ADL,mobility and/or  hygiene and is unresponsive to medication management and other conservative care Informed consent was obtained after describing risks and benefits of the procedure with the patient. This includes bleeding, bruising, infection, excessive weakness, or medication side effects. A REMS form is on file and signed. Needle: 27u 1"  needle electrode Number of units per muscle  Right brachialis 50 units- consider increase to 75U Right Brachioradialis 50 units- consider reduce too 25U Right FCR 50 units Right FDP 25 units  Right FDS 25 units All injections were done after obtaining appropriate EMG activity and after negative drawback for blood. The patient tolerated the procedure well. Post procedure instructions were given. A followup appointment was made.

## 2023-12-27 ENCOUNTER — Encounter: Attending: Physical Medicine & Rehabilitation | Admitting: Physical Medicine & Rehabilitation

## 2023-12-27 ENCOUNTER — Encounter: Payer: Self-pay | Admitting: Physical Medicine & Rehabilitation

## 2023-12-27 VITALS — BP 164/89 | HR 87 | Ht 62.0 in | Wt 137.0 lb

## 2023-12-27 DIAGNOSIS — G8111 Spastic hemiplegia affecting right dominant side: Secondary | ICD-10-CM | POA: Insufficient documentation

## 2023-12-27 MED ORDER — INCOBOTULINUMTOXINA 100 UNITS IM SOLR
200.0000 [IU] | Freq: Once | INTRAMUSCULAR | Status: AC
  Administered 2023-12-27: 200 [IU] via INTRAMUSCULAR

## 2023-12-27 NOTE — Patient Instructions (Signed)

## 2023-12-27 NOTE — Progress Notes (Signed)
 xeomin  Injection for spasticity using needle EMG guidance  Dilution: 50 Units/ml Indication: Severe spasticity which interferes with ADL,mobility and/or  hygiene and is unresponsive to medication management and other conservative care Informed consent was obtained after describing risks and benefits of the procedure with the patient. This includes bleeding, bruising, infection, excessive weakness, or medication side effects. A REMS form is on file and signed. Needle: 27u 1"  needle electrode Number of units per muscle  Right brachialis  75U Right Brachioradialis 25 units- Right FCR 50 units Right FDP 25 units  Right FDS 25 units All injections were done after obtaining appropriate EMG activity and after negative drawback for blood. The patient tolerated the procedure well. Post procedure instructions were given. A followup appointment was made.

## 2024-03-28 ENCOUNTER — Encounter: Attending: Physical Medicine & Rehabilitation | Admitting: Physical Medicine & Rehabilitation

## 2024-03-28 ENCOUNTER — Encounter: Payer: Self-pay | Admitting: Physical Medicine & Rehabilitation

## 2024-03-28 VITALS — BP 167/71 | HR 66 | Ht 62.0 in | Wt 136.0 lb

## 2024-03-28 DIAGNOSIS — G8111 Spastic hemiplegia affecting right dominant side: Secondary | ICD-10-CM | POA: Insufficient documentation

## 2024-03-28 MED ORDER — INCOBOTULINUMTOXINA 100 UNITS IM SOLR
200.0000 [IU] | Freq: Once | INTRAMUSCULAR | Status: AC
  Administered 2024-03-28: 200 [IU] via INTRAMUSCULAR

## 2024-03-28 MED ORDER — SODIUM CHLORIDE (PF) 0.9 % IJ SOLN
4.0000 mL | Freq: Once | INTRAMUSCULAR | Status: AC
  Administered 2024-03-28: 4 mL

## 2024-03-28 NOTE — Progress Notes (Signed)
 xeomin  Injection for spasticity using needle EMG guidance  Dilution: 50 Units/ml Indication: Severe spasticity which interferes with ADL,mobility and/or  hygiene and is unresponsive to medication management and other conservative care Informed consent was obtained after describing risks and benefits of the procedure with the patient. This includes bleeding, bruising, infection, excessive weakness, or medication side effects. A REMS form is on file and signed. Needle: 27u 1  needle electrode Number of units per muscle  Right brachialis  75U Right Brachioradialis 25 units-   FCR 50 units Right FDP 25 units  Right FDS 25 units All injections were done after obtaining appropriate EMG activity and after negative drawback for blood. The patient tolerated the procedure well. Post procedure instructions were given. A followup appointment was made.

## 2024-06-27 ENCOUNTER — Encounter: Admitting: Physical Medicine & Rehabilitation

## 2024-08-05 ENCOUNTER — Encounter: Admitting: Physical Medicine & Rehabilitation
